# Patient Record
Sex: Male | Born: 1970 | Race: White | Hispanic: No | Smoking: Never smoker
Health system: Southern US, Community
[De-identification: ages and names within clinical notes are randomized; demographics above are authoritative.]

## PROBLEM LIST (undated history)

## (undated) DIAGNOSIS — I471 Supraventricular tachycardia, unspecified: Secondary | ICD-10-CM

## (undated) DIAGNOSIS — R002 Palpitations: Secondary | ICD-10-CM

## (undated) DIAGNOSIS — C649 Malignant neoplasm of unspecified kidney, except renal pelvis: Secondary | ICD-10-CM

## (undated) DIAGNOSIS — E669 Obesity, unspecified: Secondary | ICD-10-CM

## (undated) DIAGNOSIS — I10 Essential (primary) hypertension: Secondary | ICD-10-CM

## (undated) DIAGNOSIS — N289 Disorder of kidney and ureter, unspecified: Secondary | ICD-10-CM

## (undated) HISTORY — DX: Supraventricular tachycardia: I47.1

## (undated) HISTORY — DX: Palpitations: R00.2

## (undated) HISTORY — PX: TONSILLECTOMY: SUR1361

## (undated) HISTORY — DX: Essential (primary) hypertension: I10

## (undated) HISTORY — DX: Supraventricular tachycardia, unspecified: I47.10

## (undated) HISTORY — DX: Obesity, unspecified: E66.9

---

## 2004-05-18 ENCOUNTER — Emergency Department (HOSPITAL_COMMUNITY): Admission: EM | Admit: 2004-05-18 | Discharge: 2004-05-18 | Payer: Self-pay | Admitting: *Deleted

## 2010-04-25 ENCOUNTER — Emergency Department (HOSPITAL_COMMUNITY): Admission: EM | Admit: 2010-04-25 | Discharge: 2010-04-25 | Payer: Self-pay | Admitting: Emergency Medicine

## 2010-05-08 ENCOUNTER — Ambulatory Visit (HOSPITAL_COMMUNITY): Admission: RE | Admit: 2010-05-08 | Discharge: 2010-05-08 | Payer: Self-pay | Admitting: Urology

## 2011-01-20 LAB — URINALYSIS, ROUTINE W REFLEX MICROSCOPIC
Bilirubin Urine: NEGATIVE
Glucose, UA: NEGATIVE mg/dL
Ketones, ur: NEGATIVE mg/dL
Nitrite: NEGATIVE
pH: 6.5 (ref 5.0–8.0)

## 2011-04-15 ENCOUNTER — Other Ambulatory Visit: Payer: Self-pay | Admitting: Cardiovascular Disease

## 2011-04-15 MED ORDER — NEBIVOLOL HCL 10 MG PO TABS: 10.0000 mg | ORAL_TABLET | Freq: Every day | ORAL | Status: DC

## 2011-04-15 NOTE — Telephone Encounter (Signed)
Wife called and informed he needs an office visit before meds run out again. 90 days given, needs one year visit. Wife verbalized understanding. Alfonso Ramus RN

## 2011-04-15 NOTE — Telephone Encounter (Signed)
NEEDS REFILL FOR BYSTOLIC 10MG  ONE DAILY/USES [facility] FAX [phone]. PHONE [phone]. CHART IN BOX.

## 2011-05-07 ENCOUNTER — Ambulatory Visit: Payer: Self-pay | Admitting: Cardiovascular Disease

## 2011-05-28 ENCOUNTER — Encounter: Payer: Self-pay | Admitting: Cardiovascular Disease

## 2011-05-30 ENCOUNTER — Encounter: Payer: Self-pay | Admitting: Cardiovascular Disease

## 2011-05-30 ENCOUNTER — Other Ambulatory Visit: Payer: Self-pay | Admitting: *Deleted

## 2011-05-30 ENCOUNTER — Ambulatory Visit (INDEPENDENT_AMBULATORY_CARE_PROVIDER_SITE_OTHER): Payer: BC Managed Care – PPO | Admitting: Cardiovascular Disease

## 2011-05-30 DIAGNOSIS — I10 Essential (primary) hypertension: Secondary | ICD-10-CM | POA: Insufficient documentation

## 2011-05-30 DIAGNOSIS — I471 Supraventricular tachycardia, unspecified: Secondary | ICD-10-CM

## 2011-05-30 DIAGNOSIS — E669 Obesity, unspecified: Secondary | ICD-10-CM | POA: Insufficient documentation

## 2011-05-30 DIAGNOSIS — I498 Other specified cardiac arrhythmias: Secondary | ICD-10-CM

## 2011-05-30 HISTORY — DX: Supraventricular tachycardia, unspecified: I47.10

## 2011-05-30 MED ORDER — NEBIVOLOL HCL 10 MG PO TABS: 10.0000 mg | ORAL_TABLET | Freq: Every day | ORAL | Status: DC

## 2011-05-30 NOTE — Assessment & Plan Note (Signed)
Tyrone Barnett is doing very well from a cardiac standpoint.  His blood pressure is fairly well controlled. I've encouraged him to continue with a good diet and exercise and weight loss program. I'll see him again in one year.

## 2011-05-30 NOTE — Assessment & Plan Note (Signed)
He's been gradually losing some weight. We will continue with the same program.

## 2011-05-30 NOTE — Progress Notes (Signed)
Nicole Cella Date of Birth  [birth year] [facility] / [facility] [address]     [address] [place], [place]  [address] [phone]  Fax  [phone]  History of Present Illness:  Golden is a 40 year old gentleman with a history of supraventricular tachycardia and hypertension. Also has a history of obesity. He has done very well since I last saw him a year ago. He's been working out on a regular basis and has lost some weight.  He denies any chest pain or shortness of breath.  Current Outpatient Prescriptions on File Prior to Visit  Medication Sig Dispense Refill  . allopurinol (ZYLOPRIM) 100 MG tablet Take 100 mg by mouth daily.        Marland Kitchen aspirin 81 MG tablet Take 81 mg by mouth daily.        . Fiber CAPS Take by mouth daily.        . Multiple Vitamin (MULTIVITAMIN) tablet Take 1 tablet by mouth daily.        . nebivolol (BYSTOLIC) 10 MG tablet Take 1 tablet (10 mg total) by mouth daily.  90 tablet  0    No Known Allergies  Past Medical History  Diagnosis Date  . Hypertension   . Palpitations   . SVT (supraventricular tachycardia)   . Obesity     Past Surgical History  Procedure Date  . Tonsillectomy     History  Smoking status  . Never Smoker   Smokeless tobacco  . Not on file    History  Alcohol Use No    Family History  Problem Relation Age of Onset  . Hypertension Mother   . Lupus Brother   . Heart attack Maternal Grandfather     X2    Reviw of Systems:  Reviewed in the HPI.  All other systems are negative.  Physical Exam: BP 152/114  Pulse 79  Ht 5\' 10"  (1.778 m)  Wt 332 lb (150.594 kg)  BMI 47.64 kg/m2. Repeat BP was 130/90  The patient is alert and oriented x 3.  The mood and affect are normal.   Skin: warm and dry.  Color is normal.    HEENT:   the sclera are nonicteric.  The mucous membranes are moist.  The carotids are 2+ without bruits.  There is no thyromegaly.  There is no JVD.    Lungs: clear.  The  chest wall is non tender.    Heart: regular rate with a normal S1 and S2.  There are no murmurs, gallops, or rubs. The PMI is not displaced.     Abdomen: good bowel sounds.  There is no guarding or rebound.  There is no hepatosplenomegaly or tenderness.  There are no masses.   Extremities:  no clubbing, cyanosis, or edema.  The legs are without rashes.  The distal pulses are intact.   Neuro:  Cranial nerves II - XII are intact.  Motor and sensory functions are intact.    The gait is normal.  Assessment / Plan:

## 2011-05-30 NOTE — Telephone Encounter (Signed)
Patient request refill. DONE Tyrone Ramus RN

## 2011-05-31 ENCOUNTER — Encounter: Payer: Self-pay | Admitting: Cardiovascular Disease

## 2011-06-06 NOTE — Progress Notes (Signed)
msg left to call with pcp info to forward office note.

## 2012-06-06 ENCOUNTER — Ambulatory Visit: Payer: BC Managed Care – PPO | Admitting: Family Medicine

## 2012-06-06 VITALS — BP 148/92 | HR 63 | Temp 98.2°F | Resp 18 | Ht 69.0 in | Wt 329.0 lb

## 2012-06-06 DIAGNOSIS — I10 Essential (primary) hypertension: Secondary | ICD-10-CM

## 2012-06-06 DIAGNOSIS — M109 Gout, unspecified: Secondary | ICD-10-CM

## 2012-06-06 LAB — COMPREHENSIVE METABOLIC PANEL
ALT: 35 U/L (ref 0–53)
AST: 25 U/L (ref 0–37)
Albumin: 4.4 g/dL (ref 3.5–5.2)
Alkaline Phosphatase: 98 U/L (ref 39–117)
BUN: 13 mg/dL (ref 6–23)
CO2: 29 mEq/L (ref 19–32)
Calcium: 10 mg/dL (ref 8.4–10.5)
Chloride: 104 mEq/L (ref 96–112)
Creat: 0.9 mg/dL (ref 0.50–1.35)
Glucose, Bld: 91 mg/dL (ref 70–99)
Potassium: 4.4 mEq/L (ref 3.5–5.3)
Sodium: 139 mEq/L (ref 135–145)
Total Bilirubin: 1.1 mg/dL (ref 0.3–1.2)
Total Protein: 6.7 g/dL (ref 6.0–8.3)

## 2012-06-06 LAB — URIC ACID: Uric Acid, Serum: 7.9 mg/dL — ABNORMAL HIGH (ref 4.0–7.8)

## 2012-06-06 MED ORDER — NEBIVOLOL HCL 10 MG PO TABS: 10.0000 mg | ORAL_TABLET | Freq: Every day | ORAL | Status: DC

## 2012-06-06 MED ORDER — ALLOPURINOL 100 MG PO TABS: 100.0000 mg | ORAL_TABLET | Freq: Every day | ORAL | Status: DC

## 2012-06-06 NOTE — Progress Notes (Signed)
@[email or web address] @  Patient ID: Tyrone Barnett MRN: [id number], DOB: [birth year], 41 y.o. Date of Encounter: 06/06/2012, 11:40 AM  Primary Physician: [name], MD  Chief Complaint: HTN  HPI: 41 y.o. year old male with history below presents for hypertension follow up.  Also, he needs refill on his gout medicine.  Since the last time I saw Tyrone Barnett, he has started working for [employer]  and his wife has gotten pregnant (due around Thanksgiving).  He has not had any gout attacks.  He asked me to write a letter authorizing concealed weapons permit.  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  He is seen with wife  Diet consists of Peps[employer] No CP, HA, visual changes, or focal deficits.   Past Medical History  Diagnosis Date  . Hypertension   . Palpitations   . SVT (supraventricular tachycardia)   . Obesity      Home Meds: Prior to Admission medications   Medication Sig Start Date End Date Taking? Authorizing Provider  allopurinol (ZYLOPRIM) 100 MG tablet Take 1 tablet (100 mg total) by mouth daily. 06/06/12  Yes [name], MD  aspirin 81 MG tablet Take 81 mg by mouth daily.     Yes Historical Provider, MD  Fiber CAPS Take by mouth daily.     Yes Historical Provider, MD  Multiple Vitamin (MULTIVITAMIN) tablet Take 1 tablet by mouth daily.     Yes Historical Provider, MD  nebivolol (BYSTOLIC) 10 MG tablet Take 1 tablet (10 mg total) by mouth daily. 06/06/12  Yes [name], MD    Allergies: No Known Allergies  History   Social History  . Marital Status: Married    Spouse Name: N/A    Number of Children: N/A  . Years of Education: N/A   Occupational History  . Not on file.   Social History Main Topics  . Smoking status: Never Smoker   . Smokeless tobacco: Not on file  . Alcohol Use: No  . Drug Use: No  . Sexually Active:    Other Topics Concern  . Not on file   Social History Narrative  . No narrative on file      Family History  Problem Relation Age of Onset  . Hypertension Mother   . Lupus Brother   . Heart attack Maternal Grandfather     X2    Review of Systems: Constitutional: negative for chills, fever, night sweats, weight changes, or fatigue  HEENT: negative for vision changes, hearing loss, congestion, rhinorrhea, ST, epistaxis, or sinus pressure Cardiovascular: negative for chest pain, palpitations, or DOE Respiratory: negative for hemoptysis, wheezing, shortness of breath, or cough Abdominal: negative for abdominal pain, nausea, vomiting, diarrhea, or constipation Dermatological: negative for rash Neurologic: negative for headache, dizziness, or syncope All other systems reviewed and are otherwise negative with the exception to those above and in the HPI.   Physical Exam:  BP recheck at 145/94 Blood pressure 148/92, pulse 63, temperature 98.2 F (36.8 C), temperature source Oral, resp. rate 18, height 5\' 9"  (1.753 m), weight 329 lb (149.233 kg), SpO2 97.00%., Body mass index is 48.58 kg/(m^2). General: Well developed, well nourished, in no acute distress. Head: Normocephalic, atraumatic, eyes without discharge, sclera non-icteric, nares are without discharge. Bilateral auditory canals clear, TM's are without perforation, pearly grey and translucent with reflective cone of light bilaterally. Oral cavity moist, posterior pharynx without exudate, erythema, peritonsillar abscess, or post nasal drip.  Neck: Supple. No  thyromegaly. Full ROM. No lymphadenopathy. No carotid bruits. Lungs: Clear bilaterally to auscultation without wheezes, rales, or rhonchi. Breathing is unlabored. Heart: RRR with S1 S2. No murmurs, rubs, or gallops appreciated.  Abdomen: Soft, non-tender, non-distended with normoactive bowel sounds. No hepatosplenomegaly. No rebound/guarding. No obvious abdominal masses. Msk:  Strength and tone normal for age. Extremities/Skin: Warm and dry. No clubbing or cyanosis. No  edema. No rashes or suspicious lesions. Distal pulses 2+ and equal bilaterally. Neuro: Alert and oriented X 3. Moves all extremities spontaneously. Gait is normal. CNII-XII grossly in tact. DTR 2+, cerebellar function intact. Rhomberg normal. Psych:  Responds to questions appropriately with a normal affect.   Labs:  CMP pending  ASSESSMENT AND PLAN:  41 y.o. year old male with obesity, gout and hypertension.  He agrees to start a weight loss and exercise program in consideration of impending fatherhood. 1. Gout  Comprehensive metabolic panel, Uric Acid, allopurinol (ZYLOPRIM) 100 MG tablet  2. Hypertension  nebivolol (BYSTOLIC) 10 MG tablet   See letter written for concealed gun permit -  Signed, [name], MD 06/06/2012 11:40 AM

## 2013-05-23 ENCOUNTER — Emergency Department (HOSPITAL_COMMUNITY)
Admission: EM | Admit: 2013-05-23 | Discharge: 2013-05-23 | Disposition: A | Discharge status: Home / Self Care | Payer: BC Managed Care – PPO | Attending: Emergency Medicine | Admitting: Emergency Medicine

## 2013-05-23 ENCOUNTER — Encounter (HOSPITAL_COMMUNITY): Payer: Self-pay | Admitting: *Deleted

## 2013-05-23 ENCOUNTER — Emergency Department (HOSPITAL_COMMUNITY): Payer: BC Managed Care – PPO

## 2013-05-23 DIAGNOSIS — Z7982 Long term (current) use of aspirin: Secondary | ICD-10-CM | POA: Insufficient documentation

## 2013-05-23 DIAGNOSIS — Z79899 Other long term (current) drug therapy: Secondary | ICD-10-CM | POA: Insufficient documentation

## 2013-05-23 DIAGNOSIS — I10 Essential (primary) hypertension: Secondary | ICD-10-CM | POA: Insufficient documentation

## 2013-05-23 DIAGNOSIS — Y929 Unspecified place or not applicable: Secondary | ICD-10-CM | POA: Insufficient documentation

## 2013-05-23 DIAGNOSIS — IMO0002 Reserved for concepts with insufficient information to code with codable children: Secondary | ICD-10-CM | POA: Insufficient documentation

## 2013-05-23 DIAGNOSIS — T148XXA Other injury of unspecified body region, initial encounter: Secondary | ICD-10-CM

## 2013-05-23 DIAGNOSIS — E669 Obesity, unspecified: Secondary | ICD-10-CM | POA: Insufficient documentation

## 2013-05-23 DIAGNOSIS — X58XXXA Exposure to other specified factors, initial encounter: Secondary | ICD-10-CM | POA: Insufficient documentation

## 2013-05-23 DIAGNOSIS — R1032 Left lower quadrant pain: Secondary | ICD-10-CM | POA: Insufficient documentation

## 2013-05-23 DIAGNOSIS — Z87442 Personal history of urinary calculi: Secondary | ICD-10-CM | POA: Insufficient documentation

## 2013-05-23 DIAGNOSIS — Y9389 Activity, other specified: Secondary | ICD-10-CM | POA: Insufficient documentation

## 2013-05-23 HISTORY — DX: Disorder of kidney and ureter, unspecified: N28.9

## 2013-05-23 MED ORDER — OXYCODONE-ACETAMINOPHEN 5-325 MG PO TABS: 1.0000 | ORAL_TABLET | ORAL | Status: DC | PRN

## 2013-05-23 MED ORDER — OXYCODONE-ACETAMINOPHEN 5-325 MG PO TABS
1.0000 | ORAL_TABLET | Freq: Once | ORAL | Status: AC
  Administered 2013-05-23: 1 via ORAL
  Filled 2013-05-23: qty 1

## 2013-05-23 NOTE — ED Provider Notes (Signed)
History  This chart was scribed for [name], MD by Ardelia Mems, ED Scribe. This patient was seen in room APA09/APA09 and the patient's care was started at 8:40 PM.  CSN: [id number]  Arrival date & time 05/23/13  2007   Chief Complaint  Patient presents with  . Groin Pain    The history is provided by the patient. No language interpreter was used.   HPI Comments: Tyrone Barnett is a 42 y.o. male with a hx of obesity, HTN and kidney stones who presents to the Emergency Department complaining of 1 month of intermittent left-sided groin pain. Pt states that his pain became worse last night when he was moving a water heater. He states that his pain is worse with certain movements or rotations of his hips. He denies pain with raising his left leg. There is no associated swelling. He states that his current pain doesn't feel pain associated with past kidney stones. He denies any recent falls or other traumas. He denies pain, swelling or other symptoms of his penis, testicles or scrotum. He also denies dysuria, hematuria, frequency, urgency, diarrhea, constipation, fever or any other symptoms. Pt is an occasional alcohol user and denies any hx of smoking.   PCP- Dr. [name]   Past Medical History  Diagnosis Date  . Hypertension   . Palpitations   . SVT (supraventricular tachycardia)   . Obesity   . Renal disorder     kidney stones   Past Surgical History  Procedure Laterality Date  . Tonsillectomy     Family History  Problem Relation Age of Onset  . Hypertension Mother   . Lupus Brother   . Heart attack Maternal Grandfather     X2   History  Substance Use Topics  . Smoking status: Never Smoker   . Smokeless tobacco: Not on file  . Alcohol Use: Yes     Comment: occasionally    Review of Systems  Gastrointestinal: Negative for diarrhea and constipation.  Genitourinary: Negative for dysuria, urgency, frequency, hematuria, penile swelling, scrotal swelling,  penile pain and testicular pain.       Left-sided groin pain.  All other systems reviewed and are negative.   Allergies  Review of patient's allergies indicates no known allergies.  Home Medications   Current Outpatient Rx  Name  Route  Sig  Dispense  Refill  . allopurinol (ZYLOPRIM) 100 MG tablet   Oral   Take 1 tablet (100 mg total) by mouth daily.   90 tablet   3   . aspirin 81 MG tablet   Oral   Take 81 mg by mouth daily.           . Fiber CAPS   Oral   Take 3 tablets by mouth daily.          . Multiple Vitamin (MULTIVITAMIN) tablet   Oral   Take 1 tablet by mouth daily.           . nebivolol (BYSTOLIC) 10 MG tablet   Oral   Take 1 tablet (10 mg total) by mouth daily.   90 tablet   3   . oxyCODONE-acetaminophen (PERCOCET) 5-325 MG per tablet   Oral   Take 1 tablet by mouth every 4 (four) hours as needed for pain.   20 tablet   0    Triage Vitals: BP 149/90  Pulse 84  Temp(Src) 98.3 F (36.8 C) (Oral)  Resp 20  Ht 5\' 10"  (1.778 m)  Wt 300 lb (136.079 kg)  BMI 43.05 kg/m2  SpO2 100%  Physical Exam  Nursing note and vitals reviewed. Constitutional: He is oriented to person, place, and time. He appears well-developed and well-nourished.  HENT:  Head: Normocephalic and atraumatic.  Right Ear: External ear normal.  Left Ear: External ear normal.  Eyes: Conjunctivae and EOM are normal. Pupils are equal, round, and reactive to light.  Neck: Normal range of motion and phonation normal. Neck supple.  Cardiovascular: Normal rate, regular rhythm, normal heart sounds and intact distal pulses.   Pulmonary/Chest: Effort normal and breath sounds normal. He exhibits no bony tenderness.  Abdominal: Soft. Normal appearance. There is tenderness.  Very low, mild LLQ abdominal tenderness.  Genitourinary: Penis normal.  No tenderness or swelling along the left inguinal ligament. No CVA tenderness. Normal scrotum and testes.  Musculoskeletal: Normal range of  motion.  Tender left anterior superior iliac spine, mild. No mass. Normal ROM of the legs without pain.   Neurological: He is alert and oriented to person, place, and time. He has normal strength. No cranial nerve deficit or sensory deficit. He exhibits normal muscle tone. Coordination normal.  Skin: Skin is warm, dry and intact.  Psychiatric: He has a normal mood and affect. His behavior is normal. Judgment and thought content normal.    ED Course  Procedures (including critical care time)  DIAGNOSTIC STUDIES:  Patient Vitals for the past 24 hrs:  BP Temp Temp src Pulse Resp SpO2 Height Weight  05/23/13 2011 149/90 mmHg 98.3 F (36.8 C) Oral 84 20 100 % 5\' 10"  (1.778 m) 300 lb (136.079 kg)    COORDINATION OF CARE: 8:45 PM- Pt advised of plan for treatment and pt agrees. 9:42 PM- Recheck with pt and pt is feeling better after receiving Percocet. Pt informed of normal radiology findings and pt is agreeable to discharge. Pt agrees to follow up with his PCP if symptoms persist or worsen.   Medications  oxyCODONE-acetaminophen (PERCOCET/ROXICET) 5-325 MG per tablet 1 tablet (1 tablet Oral Given 05/23/13 2108)     Labs Reviewed - No data to display  Dg Pelvis 1-2 Views  05/23/2013   *RADIOLOGY REPORT*  Clinical Data: Groin pain.  Denies injury.  PELVIS - 1-2 VIEW  Comparison: Abdomen and pelvis CT, 04/25/2010  Findings: No fracture.  No bone lesion.  The hip joints, SI joints and symphysis pubis are normally spaced and aligned.  The soft tissues are unremarkable.  IMPRESSION: Normal AP pelvis radiograph.   Original Report Authenticated By: Amie Portland, M.D.    1. Muscle strain     MDM  Nonspecific left groin and pelvic pain, most likely related to a muscle strain. Doubt fracture, bony tumor, intra-abdominal infectious process or metabolic instability. He is stable for discharge with symptomatic treatment.  Nursing Notes Reviewed/ Care Coordinated, and agree without  changes. Applicable Imaging Reviewed.  Interpretation of Laboratory Data incorporated into ED treatment    Plan: Home Medications- Percocet; Home Treatments and Observation- Heat; return here if the recommended treatment, does not improve the symptoms; Recommended follow up- PCP prn          I personally performed the services described in this documentation, which was scribed in my presence. The recorded information has been reviewed and is accurate.     [name], MD 05/23/13 [phone]

## 2013-05-23 NOTE — ED Notes (Signed)
Pt reporting pain in left groin.  Reports pain worse last night and today after moving a hot water heater yesterday. Denies any radiation of pain. States pain "worse with I move a certain way".

## 2013-05-23 NOTE — ED Notes (Signed)
Pt c/o pain to left groin area

## 2013-06-29 ENCOUNTER — Other Ambulatory Visit: Payer: Self-pay | Admitting: Family Medicine

## 2013-07-08 ENCOUNTER — Other Ambulatory Visit: Payer: Self-pay | Admitting: Family Medicine

## 2013-07-15 ENCOUNTER — Encounter: Payer: Self-pay | Admitting: Family Medicine

## 2013-07-15 ENCOUNTER — Ambulatory Visit (INDEPENDENT_AMBULATORY_CARE_PROVIDER_SITE_OTHER): Payer: BC Managed Care – PPO | Admitting: Family Medicine

## 2013-07-15 VITALS — BP 142/92 | HR 61 | Temp 98.7°F | Resp 18 | Ht 68.75 in | Wt 339.0 lb

## 2013-07-15 DIAGNOSIS — M109 Gout, unspecified: Secondary | ICD-10-CM

## 2013-07-15 DIAGNOSIS — I10 Essential (primary) hypertension: Secondary | ICD-10-CM

## 2013-07-15 LAB — COMPREHENSIVE METABOLIC PANEL
ALT: 42 U/L (ref 0–53)
AST: 25 U/L (ref 0–37)
Albumin: 4.5 g/dL (ref 3.5–5.2)
Alkaline Phosphatase: 103 U/L (ref 39–117)
BUN: 11 mg/dL (ref 6–23)
CO2: 26 mEq/L (ref 19–32)
Calcium: 9.6 mg/dL (ref 8.4–10.5)
Chloride: 103 mEq/L (ref 96–112)
Creat: 0.89 mg/dL (ref 0.50–1.35)
Glucose, Bld: 108 mg/dL — ABNORMAL HIGH (ref 70–99)
Potassium: 4.4 mEq/L (ref 3.5–5.3)
Sodium: 139 mEq/L (ref 135–145)
Total Bilirubin: 1.1 mg/dL (ref 0.3–1.2)
Total Protein: 7 g/dL (ref 6.0–8.3)

## 2013-07-15 LAB — URIC ACID: Uric Acid, Serum: 7.4 mg/dL (ref 4.0–7.8)

## 2013-07-15 MED ORDER — ALLOPURINOL 100 MG PO TABS: ORAL_TABLET | ORAL | Status: DC

## 2013-07-15 MED ORDER — NEBIVOLOL HCL 10 MG PO TABS: 10.0000 mg | ORAL_TABLET | Freq: Every day | ORAL | Status: DC

## 2013-07-15 NOTE — Progress Notes (Signed)
Patient ID: Tyrone Barnett MRN: [id number], DOB: 1971/03/17, 42 y.o. Date of Encounter: 07/15/2013, 8:03 AM  Primary Physician: [name], MD  Chief Complaint: HTN  HPI: 42 y.o. year old male with history below presents for hypertension follow up.  Works for [employer]    Diet consists of No CP, HA, visual changes, or focal deficits.   Past Medical History  Diagnosis Date  . Hypertension   . Palpitations   . SVT (supraventricular tachycardia)   . Obesity   . Renal disorder     kidney stones     Home Meds: Prior to Admission medications   Medication Sig Start Date End Date Taking? Authorizing Provider  allopurinol (ZYLOPRIM) 100 MG tablet Take 1 tablet (100 mg total) by mouth daily. Needs office visit 06/29/13  Yes Nelva Nay, PA-C  aspirin 81 MG tablet Take 81 mg by mouth daily.     Yes Historical Provider, MD  BYSTOLIC 10 MG tablet TAKE 1 TABLET DAILY 07/08/13  Yes [name], MD  Fiber CAPS Take 3 tablets by mouth daily.    Yes Historical Provider, MD  Multiple Vitamin (MULTIVITAMIN) tablet Take 1 tablet by mouth daily.     Yes Historical Provider, MD    Allergies: No Known Allergies  History   Social History  . Marital Status: Married    Spouse Name: N/A    Number of Children: N/A  . Years of Education: N/A   Occupational History  . Not on file.   Social History Main Topics  . Smoking status: Never Smoker   . Smokeless tobacco: Not on file  . Alcohol Use: Yes     Comment: occasionally  . Drug Use: No  . Sexual Activity: Not on file   Other Topics Concern  . Not on file   Social History Narrative  . No narrative on file     Family History  Problem Relation Age of Onset  . Hypertension Mother   . Lupus Brother   . Heart attack Maternal Grandfather     X2    Review of Systems: Constitutional: negative for chills, fever, night sweats, weight changes, or fatigue  HEENT: negative for vision changes, hearing loss, congestion, rhinorrhea, ST,  epistaxis, or sinus pressure Cardiovascular: negative for chest pain, palpitations, or DOE Respiratory: negative for hemoptysis, wheezing, shortness of breath, or cough Abdominal: negative for abdominal pain, nausea, vomiting, diarrhea, or constipation Dermatological: negative for rash Neurologic: negative for headache, dizziness, or syncope All other systems reviewed and are otherwise negative with the exception to those above and in the HPI.   Physical Exam: Blood pressure 142/92, pulse 61, temperature 98.7 F (37.1 C), temperature source Oral, resp. rate 18, height 5' 8.75" (1.746 m), weight 339 lb (153.769 kg), SpO2 97.00%., Body mass index is 50.44 kg/(m^2). General: Well developed, well nourished, in no acute distress. Head: Normocephalic, atraumatic, eyes without discharge, sclera non-icteric, nares are without discharge. Bilateral auditory canals clear, TM's are without perforation, pearly grey and translucent with reflective cone of light bilaterally. Oral cavity moist, posterior pharynx without exudate, erythema, peritonsillar abscess, or post nasal drip.  Neck: Supple. No thyromegaly. Full ROM. No lymphadenopathy. No carotid bruits. Lungs: Clear bilaterally to auscultation without wheezes, rales, or rhonchi. Breathing is unlabored. Heart: RRR with S1 S2. No murmurs, rubs, or gallops appreciated.  Abdomen: Soft, non-tender, non-distended with normoactive bowel sounds. No hepatosplenomegaly. No rebound/guarding. No obvious abdominal masses. Msk:  Strength and tone normal for age. Extremities/Skin: Warm and dry. No  clubbing or cyanosis. No edema. No rashes or suspicious lesions. Distal pulses 2+ and equal bilaterally. Neuro: Alert and oriented X 3. Moves all extremities spontaneously. Gait is normal. CNII-XII grossly in tact. DTR 2+, cerebellar function intact. Rhomberg normal. Psych:  Responds to questions appropriately with a normal affect.    ASSESSMENT AND PLAN:  42 y.o. year old  male with  -  Signed, [name], MD 07/15/2013 8:03 AM

## 2013-07-15 NOTE — Progress Notes (Signed)
Patient ID: Tyrone Barnett MRN: [id number], DOB: 14-Dec-1970, 42 y.o. Date of Encounter: 07/15/2013, 8:13 AM  Primary Physician: [name], MD  Chief Complaint: HTN  HPI: 42 y.o. year old male with history below presents for hypertension follow up. Daughter is now 10 months and doing well.  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  No CP, HA, visual changes, or focal deficits.   Past Medical History  Diagnosis Date  . Hypertension   . Palpitations   . SVT (supraventricular tachycardia)   . Obesity   . Renal disorder     kidney stones     Home Meds: Prior to Admission medications   Medication Sig Start Date End Date Taking? Authorizing Provider  allopurinol (ZYLOPRIM) 100 MG tablet One daily 07/15/13  Yes [name], MD  aspirin 81 MG tablet Take 81 mg by mouth daily.     Yes Historical Provider, MD  Fiber CAPS Take 3 tablets by mouth daily.    Yes Historical Provider, MD  Multiple Vitamin (MULTIVITAMIN) tablet Take 1 tablet by mouth daily.     Yes Historical Provider, MD  nebivolol (BYSTOLIC) 10 MG tablet Take 1 tablet (10 mg total) by mouth daily. 07/15/13  Yes [name], MD    Allergies: No Known Allergies  History   Social History  . Marital Status: Married    Spouse Name: N/A    Number of Children: N/A  . Years of Education: N/A   Occupational History  . Not on file.   Social History Main Topics  . Smoking status: Never Smoker   . Smokeless tobacco: Not on file  . Alcohol Use: Yes     Comment: occasionally  . Drug Use: No  . Sexual Activity: Not on file   Other Topics Concern  . Not on file   Social History Narrative  . No narrative on file     Family History  Problem Relation Age of Onset  . Hypertension Mother   . Lupus Brother   . Heart attack Maternal Grandfather     X2    Review of Systems: Constitutional: negative for chills, fever, night sweats, weight changes, or  fatigue  HEENT: negative for vision changes, hearing loss, congestion, rhinorrhea, ST, epistaxis, or sinus pressure Cardiovascular: negative for chest pain, palpitations, or DOE Respiratory: negative for hemoptysis, wheezing, shortness of breath, or cough Abdominal: negative for abdominal pain, nausea, vomiting, diarrhea, or constipation Dermatological: negative for rash Neurologic: negative for headache, dizziness, or syncope All other systems reviewed and are otherwise negative with the exception to those above and in the HPI.   Physical Exam:   140/90 recheck  Blood pressure 142/92, pulse 61, temperature 98.7 F (37.1 C), temperature source Oral, resp. rate 18, height 5' 8.75" (1.746 m), weight 339 lb (153.769 kg), SpO2 97.00%., Body mass index is 50.44 kg/(m^2). General: Well developed, well nourished, in no acute distress. Head: Normocephalic, atraumatic, eyes without discharge, sclera non-icteric, nares are without discharge. Bilateral auditory canals clear, TM's are without perforation, pearly grey and translucent with reflective cone of light bilaterally. Oral cavity moist, posterior pharynx without exudate, erythema, peritonsillar abscess, or post nasal drip.  Neck: Supple. No thyromegaly. Full ROM. No lymphadenopathy. No carotid bruits. Lungs: Clear bilaterally to auscultation without wheezes, rales, or rhonchi. Breathing is unlabored. Heart: RRR with S1 S2. No murmurs, rubs, or gallops appreciated.  Abdomen: Soft, non-tender, non-distended with normoactive bowel sounds. No hepatosplenomegaly. No  rebound/guarding. No obvious abdominal masses. Msk:  Strength and tone normal for age. Extremities/Skin: Warm and dry. No clubbing or cyanosis. No edema. No rashes or suspicious lesions. Distal pulses 2+ and equal bilaterally. Neuro: Alert and oriented X 3. Moves all extremities spontaneously. Gait is normal. CNII-XII grossly in tact. DTR 2+, cerebellar function intact. Rhomberg  normal. Psych:  Responds to questions appropriately with a normal affect.    CMP pending  ASSESSMENT AND PLAN:  42 y.o. year old male with hypertension and h/o gout Gout - Plan: allopurinol (ZYLOPRIM) 100 MG tablet, Comprehensive metabolic panel  Hypertension - Plan: Comprehensive metabolic panel   -  Signed, [name], MD 07/15/2013 8:13 AM

## 2013-07-15 NOTE — Patient Instructions (Addendum)
Hypertension As your heart beats, it forces blood through your arteries. This force is your blood pressure. If the pressure is too high, it is called hypertension (HTN) or high blood pressure. HTN is dangerous because you may have it and not know it. High blood pressure may mean that your heart has to work harder to pump blood. Your arteries may be narrow or stiff. The extra work puts you at risk for heart disease, stroke, and other problems.  Blood pressure consists of two numbers, a higher number over a lower, 110/72, for example. It is stated as "110 over 72." The ideal is below 120 for the top number (systolic) and under 80 for the bottom (diastolic). Write down your blood pressure today. You should pay close attention to your blood pressure if you have certain conditions such as:  Heart failure.  Prior heart attack.  Diabetes  Chronic kidney disease.  Prior stroke.  Multiple risk factors for heart disease. To see if you have HTN, your blood pressure should be measured while you are seated with your arm held at the level of the heart. It should be measured at least twice. A one-time elevated blood pressure reading (especially in the Emergency Department) does not mean that you need treatment. There may be conditions in which the blood pressure is different between your right and left arms. It is important to see your caregiver soon for a recheck. Most people have essential hypertension which means that there is not a specific cause. This type of high blood pressure may be lowered by changing lifestyle factors such as:  Stress.  Smoking.  Lack of exercise.  Excessive weight.  Drug/tobacco/alcohol use.  Eating less salt. Most people do not have symptoms from high blood pressure until it has caused damage to the body. Effective treatment can often prevent, delay or reduce that damage. TREATMENT  When a cause has been identified, treatment for high blood pressure is directed at the  cause. There are a large number of medications to treat HTN. These fall into several categories, and your caregiver will help you select the medicines that are best for you. Medications may have side effects. You should review side effects with your caregiver. If your blood pressure stays high after you have made lifestyle changes or started on medicines,   Your medication(s) may need to be changed.  Other problems may need to be addressed.  Be certain you understand your prescriptions, and know how and when to take your medicine.  Be sure to follow up with your caregiver within the time frame advised (usually within two weeks) to have your blood pressure rechecked and to review your medications.  If you are taking more than one medicine to lower your blood pressure, make sure you know how and at what times they should be taken. Taking two medicines at the same time can result in blood pressure that is too low. SEEK IMMEDIATE MEDICAL CARE IF:  You develop a severe headache, blurred or changing vision, or confusion.  You have unusual weakness or numbness, or a faint feeling.  You have severe chest or abdominal pain, vomiting, or breathing problems. MAKE SURE YOU:   Understand these instructions.  Will watch your condition.  Will get help right away if you are not doing well or get worse. Document Released: 10/21/2005 Document Revised: 01/13/2012 Document Reviewed: 06/10/2008 ExitCare Patient Information 2014 [employer].  Gout Gout is an inflammatory condition (arthritis) caused by a buildup of uric acid crystals   in the joints. Uric acid is a chemical that is normally present in the blood. Under some circumstances, uric acid can form into crystals in your joints. This causes joint redness, soreness, and swelling (inflammation). Repeat attacks are common. Over time, uric acid crystals can form into masses (tophi) near a joint, causing disfigurement. Gout is treatable and often  preventable. CAUSES  The disease begins with elevated levels of uric acid in the blood. Uric acid is produced by your body when it breaks down a naturally found substance called purines. This also happens when you eat certain foods such as meats and fish. Causes of an elevated uric acid level include: Being passed down from parent to child (heredity). Diseases that cause increased uric acid production (obesity, psoriasis, some cancers). Excessive alcohol use. Diet, especially diets rich in meat and seafood. Medicines, including certain cancer-fighting drugs (chemotherapy), diuretics, and aspirin. Chronic kidney disease. The kidneys are no longer able to remove uric acid well. Problems with metabolism. Conditions strongly associated with gout include: Obesity. High blood pressure. High cholesterol. Diabetes. Not everyone with elevated uric acid levels gets gout. It is not understood why some people get gout and others do not. Surgery, joint injury, and eating too much of certain foods are some of the factors that can lead to gout. SYMPTOMS  An attack of gout comes on quickly. It causes intense pain with redness, swelling, and warmth in a joint. Fever can occur. Often, only one joint is involved. Certain joints are more commonly involved: Base of the big toe. Knee. Ankle. Wrist. Finger. Without treatment, an attack usually goes away in a few days to weeks. Between attacks, you usually will not have symptoms, which is different from many other forms of arthritis. DIAGNOSIS  Your caregiver will suspect gout based on your symptoms and exam. Removal of fluid from the joint (arthrocentesis) is done to check for uric acid crystals. Your caregiver will give you a medicine that numbs the area (local anesthetic) and use a needle to remove joint fluid for exam. Gout is confirmed when uric acid crystals are seen in joint fluid, using a special microscope. Sometimes, blood, urine, and X-ray tests are  also used. TREATMENT  There are 2 phases to gout treatment: treating the sudden onset (acute) attack and preventing attacks (prophylaxis). Treatment of an Acute Attack Medicines are used. These include anti-inflammatory medicines or steroid medicines. An injection of steroid medicine into the affected joint is sometimes necessary. The painful joint is rested. Movement can worsen the arthritis. You may use warm or cold treatments on painful joints, depending which works best for you. Discuss the use of coffee, vitamin C, or cherries with your caregiver. These may be helpful treatment options. Treatment to Prevent Attacks After the acute attack subsides, your caregiver may advise prophylactic medicine. These medicines either help your kidneys eliminate uric acid from your body or decrease your uric acid production. You may need to stay on these medicines for a very long time. The early phase of treatment with prophylactic medicine can be associated with an increase in acute gout attacks. For this reason, during the first few months of treatment, your caregiver may also advise you to take medicines usually used for acute gout treatment. Be sure you understand your caregiver's directions. You should also discuss dietary treatment with your caregiver. Certain foods such as meats and fish can increase uric acid levels. Other foods such as dairy can decrease levels. Your caregiver can give you a list of foods   to avoid. HOME CARE INSTRUCTIONS  Do not take aspirin to relieve pain. This raises uric acid levels. Only take over-the-counter or prescription medicines for pain, discomfort, or fever as directed by your caregiver. Rest the joint as much as possible. When in bed, keep sheets and blankets off painful areas. Keep the affected joint raised (elevated). Use crutches if the painful joint is in your leg. Drink enough water and fluids to keep your urine clear or pale yellow. This helps your body get rid of  uric acid. Do not drink alcoholic beverages. They slow the passage of uric acid. Follow your caregiver's dietary instructions. Pay careful attention to the amount of protein you eat. Your daily diet should emphasize fruits, vegetables, whole grains, and fat-free or low-fat milk products. Maintain a healthy body weight. SEEK MEDICAL CARE IF:  You have an oral temperature above 102 F (38.9 C). You develop diarrhea, vomiting, or any side effects from medicines. You do not feel better in 24 hours, or you are getting worse. SEEK IMMEDIATE MEDICAL CARE IF:  Your joint becomes suddenly more tender and you have: Chills. An oral temperature above 102 F (38.9 C), not controlled by medicine. MAKE SURE YOU:  Understand these instructions. Will watch your condition. Will get help right away if you are not doing well or get worse. Document Released: 10/18/2000 Document Revised: 01/13/2012 Document Reviewed: 01/29/2010 ExitCare Patient Information 2014 [employer].  

## 2013-09-09 ENCOUNTER — Other Ambulatory Visit: Payer: Self-pay

## 2014-06-29 ENCOUNTER — Other Ambulatory Visit: Payer: Self-pay | Admitting: Family Medicine

## 2014-08-05 ENCOUNTER — Other Ambulatory Visit: Payer: Self-pay | Admitting: Family Medicine

## 2014-08-19 ENCOUNTER — Other Ambulatory Visit: Payer: Self-pay

## 2014-09-09 ENCOUNTER — Other Ambulatory Visit: Payer: Self-pay | Admitting: Physician Assistant

## 2014-09-19 ENCOUNTER — Telehealth: Payer: Self-pay

## 2014-09-19 MED ORDER — NEBIVOLOL HCL 10 MG PO TABS: 10.0000 mg | ORAL_TABLET | Freq: Every day | ORAL | Status: DC

## 2014-09-19 NOTE — Telephone Encounter (Signed)
Mary grace from [facility] [address] is calling to say that this pt usually uses mail order pharmacy but is completely out of his Bystolic and needs an rx to carry him thru until mail order arrives  Best phone for Os[place]ary grace at [facility] is [phone]

## 2014-09-19 NOTE — Telephone Encounter (Signed)
Sent in #30 to cover pt until appt in Dec with Dr. Milus GlazierLauenstein.

## 2014-10-06 ENCOUNTER — Encounter: Payer: Self-pay | Admitting: Family Medicine

## 2014-10-06 ENCOUNTER — Ambulatory Visit: Payer: BC Managed Care – PPO | Admitting: Family Medicine

## 2014-10-06 ENCOUNTER — Ambulatory Visit (INDEPENDENT_AMBULATORY_CARE_PROVIDER_SITE_OTHER): Payer: BC Managed Care – PPO | Admitting: Family Medicine

## 2014-10-06 VITALS — BP 130/100 | HR 71 | Temp 98.6°F | Resp 16 | Ht 69.0 in | Wt 341.0 lb

## 2014-10-06 DIAGNOSIS — I471 Supraventricular tachycardia: Secondary | ICD-10-CM

## 2014-10-06 DIAGNOSIS — M10079 Idiopathic gout, unspecified ankle and foot: Secondary | ICD-10-CM

## 2014-10-06 DIAGNOSIS — I10 Essential (primary) hypertension: Secondary | ICD-10-CM

## 2014-10-06 LAB — URIC ACID: Uric Acid, Serum: 7.2 mg/dL (ref 4.0–7.8)

## 2014-10-06 LAB — LIPID PANEL
Cholesterol: 177 mg/dL (ref 0–200)
HDL: 36 mg/dL — ABNORMAL LOW (ref >39)
LDL Cholesterol: 80 mg/dL (ref 0–99)
Total CHOL/HDL Ratio: 4.9 Ratio
Triglycerides: 303 mg/dL — ABNORMAL HIGH (ref <150)
VLDL: 61 mg/dL — ABNORMAL HIGH (ref 0–40)

## 2014-10-06 LAB — COMPREHENSIVE METABOLIC PANEL
ALT: 34 U/L (ref 0–53)
AST: 24 U/L (ref 0–37)
Albumin: 4.2 g/dL (ref 3.5–5.2)
Alkaline Phosphatase: 102 U/L (ref 39–117)
BUN: 10 mg/dL (ref 6–23)
CO2: 27 mEq/L (ref 19–32)
Calcium: 9.5 mg/dL (ref 8.4–10.5)
Chloride: 102 mEq/L (ref 96–112)
Creat: 0.78 mg/dL (ref 0.50–1.35)
Glucose, Bld: 101 mg/dL — ABNORMAL HIGH (ref 70–99)
Potassium: 4.4 mEq/L (ref 3.5–5.3)
Sodium: 139 mEq/L (ref 135–145)
Total Bilirubin: 1.1 mg/dL (ref 0.2–1.2)
Total Protein: 6.9 g/dL (ref 6.0–8.3)

## 2014-10-06 MED ORDER — ALLOPURINOL 100 MG PO TABS: ORAL_TABLET | ORAL | Status: DC

## 2014-10-06 MED ORDER — LISINOPRIL 20 MG PO TABS: 20.0000 mg | ORAL_TABLET | Freq: Every day | ORAL | Status: DC

## 2014-10-06 MED ORDER — NEBIVOLOL HCL 10 MG PO TABS: 10.0000 mg | ORAL_TABLET | Freq: Every day | ORAL | Status: DC

## 2014-10-06 NOTE — Patient Instructions (Signed)

## 2014-10-06 NOTE — Progress Notes (Signed)
Subjective:  This chart was scribed for Tyrone Barnett Dahiana Kulak, MD by Carl Bestelina Holson, Medical Scribe. This patient was seen in Room 27 and the patient's care was started at 9:47 AM.   Patient ID: Tyrone Barnett, male    DOB: 11-Jan-1971, 43 y.o.   MRN: [id number]  HPI HPI Comments: Tyrone CellaDamon J Barnett is a 43 y.o. male who presents to the [facility] for a refill of Allopurinol.  He states that he is feeling well but his 42 year-old father had an MI 2 weeks ago.  His father was discharged from the hospital yesterday after undergoing quadruple bypass surgery.  His blood pressure typically runs 130/90 when he checks it at home.  He has not experienced anymore episodes of SVT or gout.  He does not have a history of smoking.  He exercises occasionally.  He has a 42 year-old daughter.  He works at [employer].    He is complaining of intermittent pain located on the underside of his right foot that started a month and a half ago.  His pain is worse in the morning and subsides throughout the day.  He suspects he may have plantar fascitis.    Past Medical History  Diagnosis Date  . Hypertension   . Palpitations   . SVT (supraventricular tachycardia)   . Obesity   . Renal disorder     kidney stones   Past Surgical History  Procedure Laterality Date  . Tonsillectomy     Family History  Problem Relation Age of Onset  . Hypertension Mother   . Lupus Brother   . Heart attack Maternal Grandfather     X2   History   Social History  . Marital Status: Married    Spouse Name: N/A    Number of Children: N/A  . Years of Education: N/A   Occupational History  . Not on file.   Social History Main Topics  . Smoking status: Never Smoker   . Smokeless tobacco: Not on file  . Alcohol Use: Yes     Comment: occasionally  . Drug Use: No  . Sexual Activity: Not on file   Other Topics Concern  . Not on file   Social History Narrative  . No narrative on file   No Known Allergies  Review of  Systems  Musculoskeletal: Positive for arthralgias.    Objective:  Physical Exam  Constitutional: He is oriented to person, place, and time. He appears well-developed.  Morbidly obese.  HENT:  Head: Normocephalic and atraumatic.  Eyes: EOM are normal.  Neck: Normal range of motion.  Cardiovascular: Normal rate, regular rhythm and normal heart sounds.   No murmur heard. Pulmonary/Chest: Effort normal and breath sounds normal. No respiratory distress. He has no wheezes. He has no rales.  Abdominal: Soft. There is no tenderness.  Musculoskeletal: Normal range of motion.  Neurological: He is alert and oriented to person, place, and time.  Skin: Skin is warm and dry.  Psychiatric: He has a normal mood and affect. His behavior is normal.  Nursing note and vitals reviewed.  BP in exam room - 130/100  BP 130/100 mmHg  Pulse 71  Temp(Src) 98.6 F (37 C) (Oral)  Resp 16  Ht 5\' 9"  (1.753 m)  Wt 341 lb (154.677 kg)  BMI 50.33 kg/m2  SpO2 99% Assessment & Plan:  This chart was scribed in my presence and reviewed by me personally.  SVT (supraventricular tachycardia) - Plan: nebivolol (BYSTOLIC) 10 MG  tablet  Idiopathic gout of foot, unspecified chronicity, unspecified laterality - Plan: allopurinol (ZYLOPRIM) 100 MG tablet, Uric Acid  Essential hypertension - Plan: lisinopril (PRINIVIL,ZESTRIL) 20 MG tablet, Comprehensive metabolic panel, Lipid panel Patient encouraged to exercise more regularly and lose some of that weight. Signed, Tyrone Barnett Demiyah Fischbach, MD

## 2015-03-14 ENCOUNTER — Encounter (HOSPITAL_BASED_OUTPATIENT_CLINIC_OR_DEPARTMENT_OTHER): Payer: Self-pay | Admitting: Emergency Medicine

## 2015-03-14 ENCOUNTER — Emergency Department (HOSPITAL_BASED_OUTPATIENT_CLINIC_OR_DEPARTMENT_OTHER)
Admission: EM | Admit: 2015-03-14 | Discharge: 2015-03-14 | Disposition: A | Discharge status: Home / Self Care | Payer: BLUE CROSS/BLUE SHIELD | Attending: Emergency Medicine | Admitting: Emergency Medicine

## 2015-03-14 DIAGNOSIS — E669 Obesity, unspecified: Secondary | ICD-10-CM | POA: Insufficient documentation

## 2015-03-14 DIAGNOSIS — Z79899 Other long term (current) drug therapy: Secondary | ICD-10-CM | POA: Diagnosis not present

## 2015-03-14 DIAGNOSIS — Z7982 Long term (current) use of aspirin: Secondary | ICD-10-CM | POA: Diagnosis not present

## 2015-03-14 DIAGNOSIS — I10 Essential (primary) hypertension: Secondary | ICD-10-CM | POA: Diagnosis not present

## 2015-03-14 DIAGNOSIS — Z87442 Personal history of urinary calculi: Secondary | ICD-10-CM | POA: Diagnosis not present

## 2015-03-14 DIAGNOSIS — R21 Rash and other nonspecific skin eruption: Secondary | ICD-10-CM | POA: Diagnosis present

## 2015-03-14 DIAGNOSIS — L259 Unspecified contact dermatitis, unspecified cause: Secondary | ICD-10-CM | POA: Diagnosis not present

## 2015-03-14 MED ORDER — TRIAMCINOLONE ACETONIDE 0.1 % EX CREA: 1.0000 "application " | TOPICAL_CREAM | Freq: Two times a day (BID) | CUTANEOUS | Status: DC

## 2015-03-14 NOTE — ED Provider Notes (Signed)
CSN: [id number]     Arrival date & time 03/14/15  1354 History   First MD Initiated Contact with Patient 03/14/15 1407     Chief Complaint  Patient presents with  . Rash     (Consider location/radiation/quality/duration/timing/severity/associated sxs/prior Treatment) Patient is a 44 y.o. male presenting with rash. The history is provided by the patient and medical records.  Rash   This is a 44 year old male with past medical history significant for hypertension and SVT, presenting to the ED for rash to his upper chest and lower neck for the past 24 hours.  Rash is pruritic without associated fever or chills. No home with similar rash. Patient denies any changes in soaps, detergents, or other personal care products. He denies any new foods or medications.  Patient took Claritin prior to arrival with some improvement, but states itching is returning.  No recent tick exposures or other bug bites.  VSS.  Past Medical History  Diagnosis Date  . Hypertension   . Palpitations   . SVT (supraventricular tachycardia)   . Obesity   . Renal disorder     kidney stones   Past Surgical History  Procedure Laterality Date  . Tonsillectomy     Family History  Problem Relation Age of Onset  . Hypertension Mother   . Lupus Brother   . Heart attack Maternal Grandfather     X2   History  Substance Use Topics  . Smoking status: Never Smoker   . Smokeless tobacco: Not on file  . Alcohol Use: Yes     Comment: occasionally    Review of Systems  Skin: Positive for rash.  All other systems reviewed and are negative.     Allergies  Review of patient's allergies indicates no known allergies.  Home Medications   Prior to Admission medications   Medication Sig Start Date End Date Taking? Authorizing Provider  allopurinol (ZYLOPRIM) 100 MG tablet TAKE ONE TABLET BY MOUTH ONCE DAILY 10/06/14   [name] Lauenstein, MD  aspirin 81 MG tablet Take 81 mg by mouth daily.      Historical Provider, MD   Fiber CAPS Take 3 tablets by mouth daily.     Historical Provider, MD  lisinopril (PRINIVIL,ZESTRIL) 20 MG tablet Take 1 tablet (20 mg total) by mouth daily. 10/06/14   [name] Lauenstein, MD  Multiple Vitamin (MULTIVITAMIN) tablet Take 1 tablet by mouth daily.      Historical Provider, MD  nebivolol (BYSTOLIC) 10 MG tablet Take 1 tablet (10 mg total) by mouth daily. 10/06/14   [name] Lauenstein, MD   BP 171/103 mmHg  Pulse 76  Temp(Src) 98.1 F (36.7 C) (Oral)  Resp 18  Ht 5\' 10"  (1.778 m)  Wt 290 lb (131.543 kg)  BMI 41.61 kg/m2  SpO2 98%   Physical Exam  Constitutional: He is oriented to person, place, and time. He appears well-developed and well-nourished. No distress.  HENT:  Head: Normocephalic and atraumatic.  Mouth/Throat: Uvula is midline, oropharynx is clear and moist and mucous membranes are normal.  No oral lesions or oral swelling, handling secretions well, no difficulty swallowing or speaking  Eyes: Conjunctivae and EOM are normal. Pupils are equal, round, and reactive to light.  Neck: Normal range of motion. Neck supple.  Cardiovascular: Normal rate, regular rhythm and normal heart sounds.   Pulmonary/Chest: Effort normal and breath sounds normal. No respiratory distress. He has no wheezes.  Abdominal: Soft. Bowel sounds are normal. There is no tenderness. There is no guarding.  Musculoskeletal: Normal range of motion.  Neurological: He is alert and oriented to person, place, and time.  Skin: Skin is warm and dry. Rash noted. Rash is maculopapular. He is not diaphoretic.  Fine maculopapular rash scattered across anterior upper chest and lower neck; no ulcerative lesions or skin sloughing; no lesions on palms or soles  Psychiatric: He has a normal mood and affect.  Nursing note and vitals reviewed.   ED Course  Procedures (including critical care time) Labs Review Labs Reviewed - No data to display  Imaging Review No results found.   EKG Interpretation None       MDM   Final diagnoses:  Contact dermatitis   44 year old male with fine maculopapular rash to his chest for 1 day. He denies any new soaps, detergents, or other personal care products. No tick exposures or recent blood bites. Rash is pruritic. No one at home with similar rash. No lesions on palms or soles.  No oral lesions or oral swelling.  Rash appears to be a mild, localized contact dermatitis. Will start on topical triamcinolone as well as recommended Benadryl. Patient to follow-up with PCP.  Discussed plan with patient, he/she acknowledged understanding and agreed with plan of care.  Return precautions given for new or worsening symptoms.  Garlon HatchetLisa M Hershell Brandl, PA-C 03/14/15 1507  Linwood DibblesJon Knapp, MD 03/15/15 [phone]

## 2015-03-14 NOTE — ED Notes (Signed)
Pt in c/o macular rash to chest x 1 day. Reports no known allergies, but mild sensitivity to laundry detergent.

## 2015-03-14 NOTE — Discharge Instructions (Signed)
Take the prescribed medication as directed.  Also recommend to take bendaryl-- may cut tablets in half if make you drowsy. Follow-up with your primary care physician. Return to the ED for new or worsening symptoms.

## 2015-03-27 IMAGING — CR DG PELVIS 1-2V
1 series · 1 of 1 positions shown · non-contrast
Comparison: Abdomen and pelvis CT, 04/25/2010

CLINICAL DATA: Groin pain.  Denies injury.

PELVIS - 1-2 VIEW

[view not recorded]
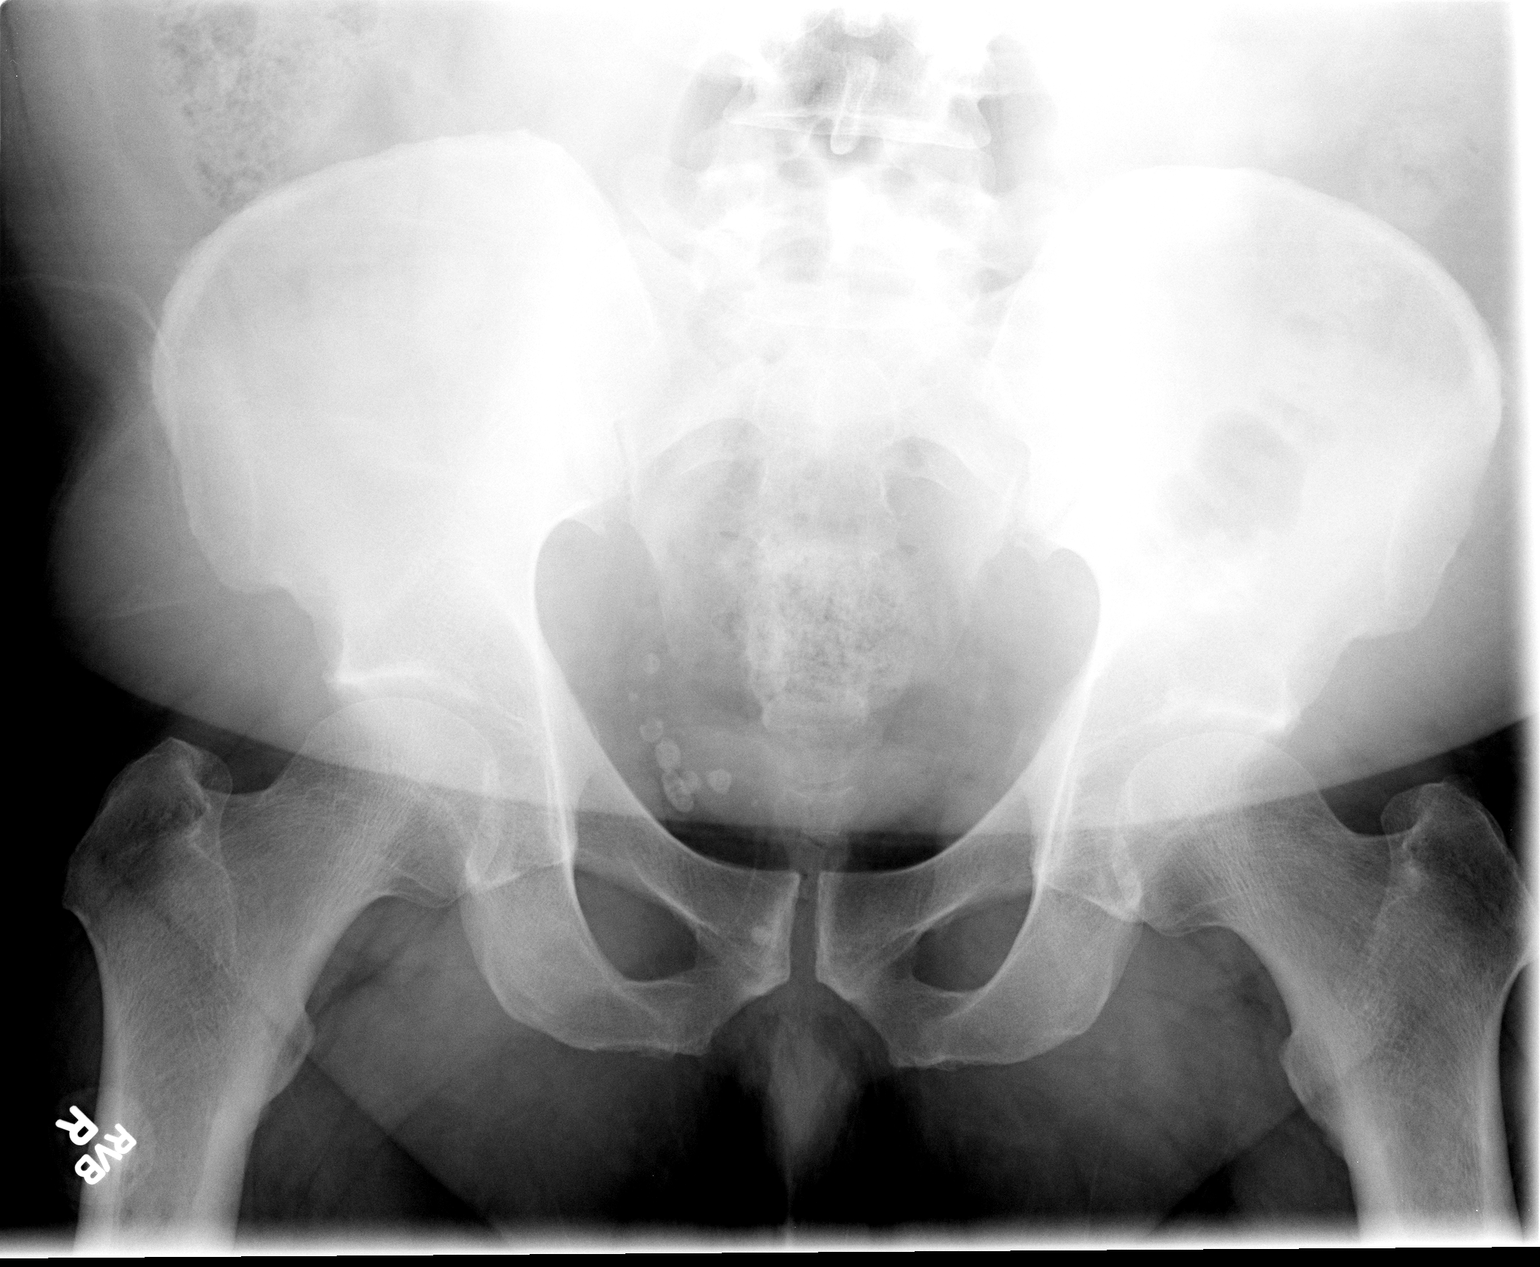

[1 of 1 positions shown; findings below may reference images not displayed]

FINDINGS: No fracture.  No bone lesion.  The hip joints, SI joints
and symphysis pubis are normally spaced and aligned.  The soft
tissues are unremarkable.
IMPRESSION: Normal AP pelvis radiograph.

## 2015-05-14 ENCOUNTER — Other Ambulatory Visit: Payer: Self-pay | Admitting: Physician Assistant

## 2015-09-20 ENCOUNTER — Other Ambulatory Visit: Payer: Self-pay | Admitting: Family Medicine

## 2015-10-02 ENCOUNTER — Other Ambulatory Visit: Payer: Self-pay | Admitting: Family Medicine

## 2016-03-21 DIAGNOSIS — Z3189 Encounter for other procreative management: Secondary | ICD-10-CM | POA: Diagnosis not present

## 2016-03-27 DIAGNOSIS — I10 Essential (primary) hypertension: Secondary | ICD-10-CM | POA: Diagnosis not present

## 2017-05-20 DIAGNOSIS — I10 Essential (primary) hypertension: Secondary | ICD-10-CM | POA: Diagnosis not present

## 2017-05-20 DIAGNOSIS — M109 Gout, unspecified: Secondary | ICD-10-CM | POA: Diagnosis not present

## 2018-05-22 DIAGNOSIS — I10 Essential (primary) hypertension: Secondary | ICD-10-CM | POA: Diagnosis not present

## 2018-05-22 DIAGNOSIS — M109 Gout, unspecified: Secondary | ICD-10-CM | POA: Diagnosis not present

## 2018-05-22 DIAGNOSIS — E782 Mixed hyperlipidemia: Secondary | ICD-10-CM | POA: Diagnosis not present

## 2019-02-11 DIAGNOSIS — E782 Mixed hyperlipidemia: Secondary | ICD-10-CM | POA: Diagnosis not present

## 2019-02-11 DIAGNOSIS — M109 Gout, unspecified: Secondary | ICD-10-CM | POA: Diagnosis not present

## 2019-02-11 DIAGNOSIS — I10 Essential (primary) hypertension: Secondary | ICD-10-CM | POA: Diagnosis not present

## 2020-02-11 DIAGNOSIS — M109 Gout, unspecified: Secondary | ICD-10-CM | POA: Diagnosis not present

## 2020-02-11 DIAGNOSIS — I10 Essential (primary) hypertension: Secondary | ICD-10-CM | POA: Diagnosis not present

## 2020-02-14 DIAGNOSIS — L988 Other specified disorders of the skin and subcutaneous tissue: Secondary | ICD-10-CM | POA: Diagnosis not present

## 2020-04-11 DIAGNOSIS — F1490 Cocaine use, unspecified, uncomplicated: Secondary | ICD-10-CM | POA: Diagnosis not present

## 2020-04-20 DIAGNOSIS — F1490 Cocaine use, unspecified, uncomplicated: Secondary | ICD-10-CM | POA: Diagnosis not present

## 2020-04-26 DIAGNOSIS — F1490 Cocaine use, unspecified, uncomplicated: Secondary | ICD-10-CM | POA: Diagnosis not present

## 2020-05-10 DIAGNOSIS — F1490 Cocaine use, unspecified, uncomplicated: Secondary | ICD-10-CM | POA: Diagnosis not present

## 2020-05-15 DIAGNOSIS — F1490 Cocaine use, unspecified, uncomplicated: Secondary | ICD-10-CM | POA: Diagnosis not present

## 2020-05-23 DIAGNOSIS — F1490 Cocaine use, unspecified, uncomplicated: Secondary | ICD-10-CM | POA: Diagnosis not present

## 2020-06-15 DIAGNOSIS — F1490 Cocaine use, unspecified, uncomplicated: Secondary | ICD-10-CM | POA: Diagnosis not present

## 2020-06-21 DIAGNOSIS — F1490 Cocaine use, unspecified, uncomplicated: Secondary | ICD-10-CM | POA: Diagnosis not present

## 2020-06-28 DIAGNOSIS — F1490 Cocaine use, unspecified, uncomplicated: Secondary | ICD-10-CM | POA: Diagnosis not present

## 2020-07-05 DIAGNOSIS — F1490 Cocaine use, unspecified, uncomplicated: Secondary | ICD-10-CM | POA: Diagnosis not present

## 2020-07-06 DIAGNOSIS — F1490 Cocaine use, unspecified, uncomplicated: Secondary | ICD-10-CM | POA: Diagnosis not present

## 2020-07-13 DIAGNOSIS — F1490 Cocaine use, unspecified, uncomplicated: Secondary | ICD-10-CM | POA: Diagnosis not present

## 2020-07-19 DIAGNOSIS — F1490 Cocaine use, unspecified, uncomplicated: Secondary | ICD-10-CM | POA: Diagnosis not present

## 2020-08-15 DIAGNOSIS — F1490 Cocaine use, unspecified, uncomplicated: Secondary | ICD-10-CM | POA: Diagnosis not present

## 2020-08-18 DIAGNOSIS — F1490 Cocaine use, unspecified, uncomplicated: Secondary | ICD-10-CM | POA: Diagnosis not present

## 2020-08-21 DIAGNOSIS — F1490 Cocaine use, unspecified, uncomplicated: Secondary | ICD-10-CM | POA: Diagnosis not present

## 2020-08-23 ENCOUNTER — Telehealth (HOSPITAL_COMMUNITY): Payer: Self-pay | Admitting: Psychiatry

## 2020-08-23 DIAGNOSIS — F1490 Cocaine use, unspecified, uncomplicated: Secondary | ICD-10-CM | POA: Diagnosis not present

## 2020-08-23 NOTE — Telephone Encounter (Signed)
D:  Pt's wife phoned to inquire about CD-IOP for her husband.  A:  Answered all her questions.  Recommended that patient call case manager back so she could orient him and give him an appt for assessment with Fulton Reek, LCSW, LCAS.  R:  Wife was receptive.

## 2020-08-25 DIAGNOSIS — F1490 Cocaine use, unspecified, uncomplicated: Secondary | ICD-10-CM | POA: Diagnosis not present

## 2020-08-28 ENCOUNTER — Ambulatory Visit (HOSPITAL_COMMUNITY): Payer: Self-pay | Admitting: Licensed Clinical Social Worker

## 2020-11-23 DIAGNOSIS — F1490 Cocaine use, unspecified, uncomplicated: Secondary | ICD-10-CM | POA: Diagnosis not present

## 2020-11-30 DIAGNOSIS — F1490 Cocaine use, unspecified, uncomplicated: Secondary | ICD-10-CM | POA: Diagnosis not present

## 2021-01-27 DIAGNOSIS — I10 Essential (primary) hypertension: Secondary | ICD-10-CM | POA: Diagnosis not present

## 2021-01-27 DIAGNOSIS — R7989 Other specified abnormal findings of blood chemistry: Secondary | ICD-10-CM | POA: Diagnosis not present

## 2021-01-27 DIAGNOSIS — R45851 Suicidal ideations: Secondary | ICD-10-CM | POA: Diagnosis not present

## 2021-01-27 DIAGNOSIS — F1410 Cocaine abuse, uncomplicated: Secondary | ICD-10-CM | POA: Diagnosis not present

## 2021-01-27 DIAGNOSIS — F17290 Nicotine dependence, other tobacco product, uncomplicated: Secondary | ICD-10-CM | POA: Diagnosis not present

## 2021-01-27 DIAGNOSIS — Z79899 Other long term (current) drug therapy: Secondary | ICD-10-CM | POA: Diagnosis not present

## 2021-01-27 DIAGNOSIS — Z7982 Long term (current) use of aspirin: Secondary | ICD-10-CM | POA: Diagnosis not present

## 2021-01-28 DIAGNOSIS — F1414 Cocaine abuse with cocaine-induced mood disorder: Secondary | ICD-10-CM

## 2021-01-28 DIAGNOSIS — R45851 Suicidal ideations: Secondary | ICD-10-CM | POA: Diagnosis not present

## 2021-01-28 DIAGNOSIS — F1420 Cocaine dependence, uncomplicated: Secondary | ICD-10-CM | POA: Insufficient documentation

## 2021-01-28 HISTORY — DX: Cocaine abuse with cocaine-induced mood disorder: F14.14

## 2021-01-28 HISTORY — DX: Cocaine dependence, uncomplicated: F14.20

## 2021-01-29 ENCOUNTER — Telehealth (HOSPITAL_COMMUNITY): Payer: Self-pay | Admitting: Psychiatry

## 2021-01-29 NOTE — Telephone Encounter (Signed)
D:  Pt phoned to inquire about CD-IOP.  A:  Oriented pt.  Patient is scheduled to see Fulton Reek, LCSW, LCAS on 02-05-21 @ 9 a.m.  Instructed pt to arrive at 8:30 a.m in order to complete paperwork, bring insurance and Har[place] a mask.  Provided pt with directions; pt requested case manager to email him all the appt info.  Inform Waldon Merl.  R:  Pt receptive.

## 2021-02-05 ENCOUNTER — Telehealth (HOSPITAL_COMMUNITY): Payer: Self-pay | Admitting: Licensed Clinical Social Worker

## 2021-02-05 ENCOUNTER — Ambulatory Visit (HOSPITAL_COMMUNITY): Payer: Self-pay | Admitting: Licensed Clinical Social Worker

## 2021-02-12 DIAGNOSIS — F1420 Cocaine dependence, uncomplicated: Secondary | ICD-10-CM | POA: Diagnosis not present

## 2021-02-12 DIAGNOSIS — F1210 Cannabis abuse, uncomplicated: Secondary | ICD-10-CM | POA: Diagnosis not present

## 2021-02-12 DIAGNOSIS — F1020 Alcohol dependence, uncomplicated: Secondary | ICD-10-CM | POA: Diagnosis not present

## 2021-02-19 DIAGNOSIS — F1020 Alcohol dependence, uncomplicated: Secondary | ICD-10-CM | POA: Diagnosis not present

## 2021-02-19 DIAGNOSIS — F1420 Cocaine dependence, uncomplicated: Secondary | ICD-10-CM | POA: Diagnosis not present

## 2021-02-19 DIAGNOSIS — F1210 Cannabis abuse, uncomplicated: Secondary | ICD-10-CM | POA: Diagnosis not present

## 2021-02-20 DIAGNOSIS — F1020 Alcohol dependence, uncomplicated: Secondary | ICD-10-CM | POA: Diagnosis not present

## 2021-02-20 DIAGNOSIS — F1420 Cocaine dependence, uncomplicated: Secondary | ICD-10-CM | POA: Diagnosis not present

## 2021-02-20 DIAGNOSIS — F1210 Cannabis abuse, uncomplicated: Secondary | ICD-10-CM | POA: Diagnosis not present

## 2021-03-09 DIAGNOSIS — J069 Acute upper respiratory infection, unspecified: Secondary | ICD-10-CM | POA: Diagnosis not present

## 2021-04-04 ENCOUNTER — Telehealth (HOSPITAL_COMMUNITY): Payer: Self-pay | Admitting: Psychiatry

## 2021-04-04 NOTE — Telephone Encounter (Signed)
D:  Albina Billet (case Production designer, theatre/television/film) at [facility] in [place] called to refer pt.  Pt will be discharging on 04-06-21 from their facility and returning back to the area.  A:  Pt is scheduled to see Fulton Reek, LCSW, LCAS for a CCA on 04-10-21 @ 9 a.m.  Instructed pt to arrive at 8:30 a.m; wearing a mask and bring insurance card.  Inform Waldon Merl.

## 2021-04-10 ENCOUNTER — Telehealth (HOSPITAL_COMMUNITY): Payer: Self-pay | Admitting: Licensed Clinical Social Worker

## 2021-04-10 ENCOUNTER — Ambulatory Visit (INDEPENDENT_AMBULATORY_CARE_PROVIDER_SITE_OTHER): Payer: Self-pay | Admitting: Licensed Clinical Social Worker

## 2021-04-10 DIAGNOSIS — Z7189 Other specified counseling: Secondary | ICD-10-CM

## 2021-04-10 DIAGNOSIS — F1220 Cannabis dependence, uncomplicated: Secondary | ICD-10-CM | POA: Diagnosis not present

## 2021-04-10 DIAGNOSIS — F419 Anxiety disorder, unspecified: Secondary | ICD-10-CM | POA: Diagnosis not present

## 2021-04-10 DIAGNOSIS — I10 Essential (primary) hypertension: Secondary | ICD-10-CM | POA: Diagnosis not present

## 2021-04-10 DIAGNOSIS — F1420 Cocaine dependence, uncomplicated: Secondary | ICD-10-CM | POA: Diagnosis not present

## 2021-04-10 DIAGNOSIS — M1A9XX0 Chronic gout, unspecified, without tophus (tophi): Secondary | ICD-10-CM | POA: Diagnosis not present

## 2021-04-11 NOTE — Progress Notes (Signed)
Client did not attend session. Client is a no show/ no call. Outreach was completed.

## 2021-04-12 DIAGNOSIS — M1A9XX0 Chronic gout, unspecified, without tophus (tophi): Secondary | ICD-10-CM | POA: Diagnosis not present

## 2021-04-12 DIAGNOSIS — F419 Anxiety disorder, unspecified: Secondary | ICD-10-CM | POA: Diagnosis not present

## 2021-04-12 DIAGNOSIS — F1220 Cannabis dependence, uncomplicated: Secondary | ICD-10-CM | POA: Diagnosis not present

## 2021-04-12 DIAGNOSIS — I10 Essential (primary) hypertension: Secondary | ICD-10-CM | POA: Diagnosis not present

## 2021-04-12 DIAGNOSIS — F1420 Cocaine dependence, uncomplicated: Secondary | ICD-10-CM | POA: Diagnosis not present

## 2021-04-12 DIAGNOSIS — E669 Obesity, unspecified: Secondary | ICD-10-CM | POA: Diagnosis not present

## 2021-04-20 DIAGNOSIS — I10 Essential (primary) hypertension: Secondary | ICD-10-CM | POA: Diagnosis not present

## 2021-04-20 DIAGNOSIS — Z03818 Encounter for observation for suspected exposure to other biological agents ruled out: Secondary | ICD-10-CM | POA: Diagnosis not present

## 2021-04-20 DIAGNOSIS — Z20822 Contact with and (suspected) exposure to covid-19: Secondary | ICD-10-CM | POA: Diagnosis not present

## 2021-06-26 DIAGNOSIS — F1490 Cocaine use, unspecified, uncomplicated: Secondary | ICD-10-CM | POA: Diagnosis not present

## 2021-07-05 DIAGNOSIS — F1490 Cocaine use, unspecified, uncomplicated: Secondary | ICD-10-CM | POA: Diagnosis not present

## 2021-07-10 DIAGNOSIS — F1490 Cocaine use, unspecified, uncomplicated: Secondary | ICD-10-CM | POA: Diagnosis not present

## 2021-07-18 DIAGNOSIS — F1490 Cocaine use, unspecified, uncomplicated: Secondary | ICD-10-CM | POA: Diagnosis not present

## 2021-07-26 DIAGNOSIS — F1490 Cocaine use, unspecified, uncomplicated: Secondary | ICD-10-CM | POA: Diagnosis not present

## 2021-07-30 DIAGNOSIS — F1490 Cocaine use, unspecified, uncomplicated: Secondary | ICD-10-CM | POA: Diagnosis not present

## 2021-08-08 DIAGNOSIS — F1490 Cocaine use, unspecified, uncomplicated: Secondary | ICD-10-CM | POA: Diagnosis not present

## 2021-10-09 DIAGNOSIS — F1420 Cocaine dependence, uncomplicated: Secondary | ICD-10-CM | POA: Diagnosis not present

## 2021-10-09 DIAGNOSIS — F1220 Cannabis dependence, uncomplicated: Secondary | ICD-10-CM | POA: Diagnosis not present

## 2021-10-09 DIAGNOSIS — F1020 Alcohol dependence, uncomplicated: Secondary | ICD-10-CM | POA: Diagnosis not present

## 2021-10-10 DIAGNOSIS — F1420 Cocaine dependence, uncomplicated: Secondary | ICD-10-CM | POA: Diagnosis not present

## 2021-10-10 DIAGNOSIS — F1220 Cannabis dependence, uncomplicated: Secondary | ICD-10-CM | POA: Diagnosis not present

## 2021-10-10 DIAGNOSIS — F1020 Alcohol dependence, uncomplicated: Secondary | ICD-10-CM | POA: Diagnosis not present

## 2021-10-11 DIAGNOSIS — F1020 Alcohol dependence, uncomplicated: Secondary | ICD-10-CM | POA: Diagnosis not present

## 2021-10-11 DIAGNOSIS — F1220 Cannabis dependence, uncomplicated: Secondary | ICD-10-CM | POA: Diagnosis not present

## 2021-10-11 DIAGNOSIS — F1420 Cocaine dependence, uncomplicated: Secondary | ICD-10-CM | POA: Diagnosis not present

## 2021-10-12 DIAGNOSIS — F1420 Cocaine dependence, uncomplicated: Secondary | ICD-10-CM | POA: Diagnosis not present

## 2021-10-12 DIAGNOSIS — F1220 Cannabis dependence, uncomplicated: Secondary | ICD-10-CM | POA: Diagnosis not present

## 2021-10-12 DIAGNOSIS — F1020 Alcohol dependence, uncomplicated: Secondary | ICD-10-CM | POA: Diagnosis not present

## 2021-10-13 DIAGNOSIS — F1020 Alcohol dependence, uncomplicated: Secondary | ICD-10-CM | POA: Diagnosis not present

## 2021-10-13 DIAGNOSIS — F1220 Cannabis dependence, uncomplicated: Secondary | ICD-10-CM | POA: Diagnosis not present

## 2021-10-13 DIAGNOSIS — F1420 Cocaine dependence, uncomplicated: Secondary | ICD-10-CM | POA: Diagnosis not present

## 2021-10-15 DIAGNOSIS — F1420 Cocaine dependence, uncomplicated: Secondary | ICD-10-CM | POA: Diagnosis not present

## 2021-10-15 DIAGNOSIS — F1220 Cannabis dependence, uncomplicated: Secondary | ICD-10-CM | POA: Diagnosis not present

## 2021-10-15 DIAGNOSIS — F1020 Alcohol dependence, uncomplicated: Secondary | ICD-10-CM | POA: Diagnosis not present

## 2021-10-16 DIAGNOSIS — F1020 Alcohol dependence, uncomplicated: Secondary | ICD-10-CM | POA: Diagnosis not present

## 2021-10-16 DIAGNOSIS — F1420 Cocaine dependence, uncomplicated: Secondary | ICD-10-CM | POA: Diagnosis not present

## 2021-10-16 DIAGNOSIS — F1220 Cannabis dependence, uncomplicated: Secondary | ICD-10-CM | POA: Diagnosis not present

## 2021-10-17 DIAGNOSIS — F1020 Alcohol dependence, uncomplicated: Secondary | ICD-10-CM | POA: Diagnosis not present

## 2021-10-17 DIAGNOSIS — F1420 Cocaine dependence, uncomplicated: Secondary | ICD-10-CM | POA: Diagnosis not present

## 2021-10-17 DIAGNOSIS — F1220 Cannabis dependence, uncomplicated: Secondary | ICD-10-CM | POA: Diagnosis not present

## 2021-10-18 DIAGNOSIS — F1220 Cannabis dependence, uncomplicated: Secondary | ICD-10-CM | POA: Diagnosis not present

## 2021-10-18 DIAGNOSIS — F1420 Cocaine dependence, uncomplicated: Secondary | ICD-10-CM | POA: Diagnosis not present

## 2021-10-18 DIAGNOSIS — F1020 Alcohol dependence, uncomplicated: Secondary | ICD-10-CM | POA: Diagnosis not present

## 2021-10-20 DIAGNOSIS — F1220 Cannabis dependence, uncomplicated: Secondary | ICD-10-CM | POA: Diagnosis not present

## 2021-10-20 DIAGNOSIS — F1020 Alcohol dependence, uncomplicated: Secondary | ICD-10-CM | POA: Diagnosis not present

## 2021-10-20 DIAGNOSIS — F1420 Cocaine dependence, uncomplicated: Secondary | ICD-10-CM | POA: Diagnosis not present

## 2021-10-22 DIAGNOSIS — F1420 Cocaine dependence, uncomplicated: Secondary | ICD-10-CM | POA: Diagnosis not present

## 2021-10-22 DIAGNOSIS — F1220 Cannabis dependence, uncomplicated: Secondary | ICD-10-CM | POA: Diagnosis not present

## 2021-10-22 DIAGNOSIS — F1020 Alcohol dependence, uncomplicated: Secondary | ICD-10-CM | POA: Diagnosis not present

## 2021-10-23 DIAGNOSIS — F1220 Cannabis dependence, uncomplicated: Secondary | ICD-10-CM | POA: Diagnosis not present

## 2021-10-23 DIAGNOSIS — F1420 Cocaine dependence, uncomplicated: Secondary | ICD-10-CM | POA: Diagnosis not present

## 2021-10-23 DIAGNOSIS — F1020 Alcohol dependence, uncomplicated: Secondary | ICD-10-CM | POA: Diagnosis not present

## 2021-10-24 DIAGNOSIS — F1020 Alcohol dependence, uncomplicated: Secondary | ICD-10-CM | POA: Diagnosis not present

## 2021-10-24 DIAGNOSIS — F1420 Cocaine dependence, uncomplicated: Secondary | ICD-10-CM | POA: Diagnosis not present

## 2021-10-24 DIAGNOSIS — F1220 Cannabis dependence, uncomplicated: Secondary | ICD-10-CM | POA: Diagnosis not present

## 2021-10-25 DIAGNOSIS — F1020 Alcohol dependence, uncomplicated: Secondary | ICD-10-CM | POA: Diagnosis not present

## 2021-10-25 DIAGNOSIS — F1220 Cannabis dependence, uncomplicated: Secondary | ICD-10-CM | POA: Diagnosis not present

## 2021-10-25 DIAGNOSIS — F1420 Cocaine dependence, uncomplicated: Secondary | ICD-10-CM | POA: Diagnosis not present

## 2021-10-26 DIAGNOSIS — F1420 Cocaine dependence, uncomplicated: Secondary | ICD-10-CM | POA: Diagnosis not present

## 2021-10-26 DIAGNOSIS — F1220 Cannabis dependence, uncomplicated: Secondary | ICD-10-CM | POA: Diagnosis not present

## 2021-10-26 DIAGNOSIS — F1020 Alcohol dependence, uncomplicated: Secondary | ICD-10-CM | POA: Diagnosis not present

## 2021-10-27 DIAGNOSIS — F1020 Alcohol dependence, uncomplicated: Secondary | ICD-10-CM | POA: Diagnosis not present

## 2021-10-27 DIAGNOSIS — F1220 Cannabis dependence, uncomplicated: Secondary | ICD-10-CM | POA: Diagnosis not present

## 2021-10-27 DIAGNOSIS — F1420 Cocaine dependence, uncomplicated: Secondary | ICD-10-CM | POA: Diagnosis not present

## 2021-10-29 DIAGNOSIS — F1220 Cannabis dependence, uncomplicated: Secondary | ICD-10-CM | POA: Diagnosis not present

## 2021-10-29 DIAGNOSIS — F1420 Cocaine dependence, uncomplicated: Secondary | ICD-10-CM | POA: Diagnosis not present

## 2021-10-29 DIAGNOSIS — F1020 Alcohol dependence, uncomplicated: Secondary | ICD-10-CM | POA: Diagnosis not present

## 2021-10-30 DIAGNOSIS — F1020 Alcohol dependence, uncomplicated: Secondary | ICD-10-CM | POA: Diagnosis not present

## 2021-10-30 DIAGNOSIS — F1220 Cannabis dependence, uncomplicated: Secondary | ICD-10-CM | POA: Diagnosis not present

## 2021-10-30 DIAGNOSIS — F1420 Cocaine dependence, uncomplicated: Secondary | ICD-10-CM | POA: Diagnosis not present

## 2021-10-31 DIAGNOSIS — F1420 Cocaine dependence, uncomplicated: Secondary | ICD-10-CM | POA: Diagnosis not present

## 2021-10-31 DIAGNOSIS — F1220 Cannabis dependence, uncomplicated: Secondary | ICD-10-CM | POA: Diagnosis not present

## 2021-10-31 DIAGNOSIS — M25562 Pain in left knee: Secondary | ICD-10-CM | POA: Diagnosis not present

## 2021-10-31 DIAGNOSIS — F1020 Alcohol dependence, uncomplicated: Secondary | ICD-10-CM | POA: Diagnosis not present

## 2021-11-01 DIAGNOSIS — F1020 Alcohol dependence, uncomplicated: Secondary | ICD-10-CM | POA: Diagnosis not present

## 2021-11-01 DIAGNOSIS — F1220 Cannabis dependence, uncomplicated: Secondary | ICD-10-CM | POA: Diagnosis not present

## 2021-11-01 DIAGNOSIS — F1420 Cocaine dependence, uncomplicated: Secondary | ICD-10-CM | POA: Diagnosis not present

## 2021-11-02 DIAGNOSIS — F1220 Cannabis dependence, uncomplicated: Secondary | ICD-10-CM | POA: Diagnosis not present

## 2021-11-02 DIAGNOSIS — F1420 Cocaine dependence, uncomplicated: Secondary | ICD-10-CM | POA: Diagnosis not present

## 2021-11-02 DIAGNOSIS — F1020 Alcohol dependence, uncomplicated: Secondary | ICD-10-CM | POA: Diagnosis not present

## 2021-11-03 DIAGNOSIS — F1420 Cocaine dependence, uncomplicated: Secondary | ICD-10-CM | POA: Diagnosis not present

## 2021-11-03 DIAGNOSIS — F1220 Cannabis dependence, uncomplicated: Secondary | ICD-10-CM | POA: Diagnosis not present

## 2021-11-03 DIAGNOSIS — F1020 Alcohol dependence, uncomplicated: Secondary | ICD-10-CM | POA: Diagnosis not present

## 2021-11-05 DIAGNOSIS — F1420 Cocaine dependence, uncomplicated: Secondary | ICD-10-CM | POA: Diagnosis not present

## 2021-11-05 DIAGNOSIS — F1020 Alcohol dependence, uncomplicated: Secondary | ICD-10-CM | POA: Diagnosis not present

## 2021-11-05 DIAGNOSIS — F1220 Cannabis dependence, uncomplicated: Secondary | ICD-10-CM | POA: Diagnosis not present

## 2021-11-06 DIAGNOSIS — F1420 Cocaine dependence, uncomplicated: Secondary | ICD-10-CM | POA: Diagnosis not present

## 2021-11-06 DIAGNOSIS — F1020 Alcohol dependence, uncomplicated: Secondary | ICD-10-CM | POA: Diagnosis not present

## 2021-11-06 DIAGNOSIS — F1220 Cannabis dependence, uncomplicated: Secondary | ICD-10-CM | POA: Diagnosis not present

## 2021-11-09 DIAGNOSIS — F1420 Cocaine dependence, uncomplicated: Secondary | ICD-10-CM | POA: Diagnosis not present

## 2021-11-09 DIAGNOSIS — F1220 Cannabis dependence, uncomplicated: Secondary | ICD-10-CM | POA: Diagnosis not present

## 2021-11-09 DIAGNOSIS — F1020 Alcohol dependence, uncomplicated: Secondary | ICD-10-CM | POA: Diagnosis not present

## 2021-11-12 DIAGNOSIS — F1420 Cocaine dependence, uncomplicated: Secondary | ICD-10-CM | POA: Diagnosis not present

## 2021-11-12 DIAGNOSIS — F1220 Cannabis dependence, uncomplicated: Secondary | ICD-10-CM | POA: Diagnosis not present

## 2021-11-12 DIAGNOSIS — F1020 Alcohol dependence, uncomplicated: Secondary | ICD-10-CM | POA: Diagnosis not present

## 2021-11-13 DIAGNOSIS — F1020 Alcohol dependence, uncomplicated: Secondary | ICD-10-CM | POA: Diagnosis not present

## 2021-11-13 DIAGNOSIS — F1220 Cannabis dependence, uncomplicated: Secondary | ICD-10-CM | POA: Diagnosis not present

## 2021-11-13 DIAGNOSIS — F1420 Cocaine dependence, uncomplicated: Secondary | ICD-10-CM | POA: Diagnosis not present

## 2021-11-14 DIAGNOSIS — F1420 Cocaine dependence, uncomplicated: Secondary | ICD-10-CM | POA: Diagnosis not present

## 2021-11-14 DIAGNOSIS — F1020 Alcohol dependence, uncomplicated: Secondary | ICD-10-CM | POA: Diagnosis not present

## 2021-11-14 DIAGNOSIS — F1220 Cannabis dependence, uncomplicated: Secondary | ICD-10-CM | POA: Diagnosis not present

## 2021-11-15 DIAGNOSIS — F1020 Alcohol dependence, uncomplicated: Secondary | ICD-10-CM | POA: Diagnosis not present

## 2021-11-15 DIAGNOSIS — F1420 Cocaine dependence, uncomplicated: Secondary | ICD-10-CM | POA: Diagnosis not present

## 2021-11-15 DIAGNOSIS — F1220 Cannabis dependence, uncomplicated: Secondary | ICD-10-CM | POA: Diagnosis not present

## 2021-11-16 DIAGNOSIS — F1420 Cocaine dependence, uncomplicated: Secondary | ICD-10-CM | POA: Diagnosis not present

## 2021-11-16 DIAGNOSIS — F1220 Cannabis dependence, uncomplicated: Secondary | ICD-10-CM | POA: Diagnosis not present

## 2021-11-16 DIAGNOSIS — F1020 Alcohol dependence, uncomplicated: Secondary | ICD-10-CM | POA: Diagnosis not present

## 2021-11-19 DIAGNOSIS — F1420 Cocaine dependence, uncomplicated: Secondary | ICD-10-CM | POA: Diagnosis not present

## 2021-11-19 DIAGNOSIS — F1020 Alcohol dependence, uncomplicated: Secondary | ICD-10-CM | POA: Diagnosis not present

## 2021-11-19 DIAGNOSIS — F1220 Cannabis dependence, uncomplicated: Secondary | ICD-10-CM | POA: Diagnosis not present

## 2021-11-21 DIAGNOSIS — F1420 Cocaine dependence, uncomplicated: Secondary | ICD-10-CM | POA: Diagnosis not present

## 2021-11-21 DIAGNOSIS — F1020 Alcohol dependence, uncomplicated: Secondary | ICD-10-CM | POA: Diagnosis not present

## 2021-11-21 DIAGNOSIS — F1220 Cannabis dependence, uncomplicated: Secondary | ICD-10-CM | POA: Diagnosis not present

## 2021-11-22 DIAGNOSIS — F1020 Alcohol dependence, uncomplicated: Secondary | ICD-10-CM | POA: Diagnosis not present

## 2021-11-22 DIAGNOSIS — F1220 Cannabis dependence, uncomplicated: Secondary | ICD-10-CM | POA: Diagnosis not present

## 2021-11-22 DIAGNOSIS — F1420 Cocaine dependence, uncomplicated: Secondary | ICD-10-CM | POA: Diagnosis not present

## 2021-11-23 DIAGNOSIS — F1020 Alcohol dependence, uncomplicated: Secondary | ICD-10-CM | POA: Diagnosis not present

## 2021-11-23 DIAGNOSIS — F1220 Cannabis dependence, uncomplicated: Secondary | ICD-10-CM | POA: Diagnosis not present

## 2021-11-23 DIAGNOSIS — F1420 Cocaine dependence, uncomplicated: Secondary | ICD-10-CM | POA: Diagnosis not present

## 2021-12-10 DIAGNOSIS — Z20822 Contact with and (suspected) exposure to covid-19: Secondary | ICD-10-CM | POA: Diagnosis not present

## 2021-12-11 DIAGNOSIS — F1020 Alcohol dependence, uncomplicated: Secondary | ICD-10-CM | POA: Diagnosis not present

## 2021-12-11 DIAGNOSIS — F1420 Cocaine dependence, uncomplicated: Secondary | ICD-10-CM | POA: Diagnosis not present

## 2021-12-11 DIAGNOSIS — F1220 Cannabis dependence, uncomplicated: Secondary | ICD-10-CM | POA: Diagnosis not present

## 2021-12-12 DIAGNOSIS — F1020 Alcohol dependence, uncomplicated: Secondary | ICD-10-CM | POA: Diagnosis not present

## 2021-12-12 DIAGNOSIS — F1420 Cocaine dependence, uncomplicated: Secondary | ICD-10-CM | POA: Diagnosis not present

## 2021-12-12 DIAGNOSIS — F1220 Cannabis dependence, uncomplicated: Secondary | ICD-10-CM | POA: Diagnosis not present

## 2021-12-13 DIAGNOSIS — F1420 Cocaine dependence, uncomplicated: Secondary | ICD-10-CM | POA: Diagnosis not present

## 2021-12-13 DIAGNOSIS — F1220 Cannabis dependence, uncomplicated: Secondary | ICD-10-CM | POA: Diagnosis not present

## 2021-12-13 DIAGNOSIS — F1020 Alcohol dependence, uncomplicated: Secondary | ICD-10-CM | POA: Diagnosis not present

## 2021-12-14 DIAGNOSIS — F1220 Cannabis dependence, uncomplicated: Secondary | ICD-10-CM | POA: Diagnosis not present

## 2021-12-14 DIAGNOSIS — F1420 Cocaine dependence, uncomplicated: Secondary | ICD-10-CM | POA: Diagnosis not present

## 2021-12-14 DIAGNOSIS — F1020 Alcohol dependence, uncomplicated: Secondary | ICD-10-CM | POA: Diagnosis not present

## 2021-12-15 DIAGNOSIS — F1220 Cannabis dependence, uncomplicated: Secondary | ICD-10-CM | POA: Diagnosis not present

## 2021-12-15 DIAGNOSIS — F1420 Cocaine dependence, uncomplicated: Secondary | ICD-10-CM | POA: Diagnosis not present

## 2021-12-15 DIAGNOSIS — F1020 Alcohol dependence, uncomplicated: Secondary | ICD-10-CM | POA: Diagnosis not present

## 2021-12-17 DIAGNOSIS — F1020 Alcohol dependence, uncomplicated: Secondary | ICD-10-CM | POA: Diagnosis not present

## 2021-12-17 DIAGNOSIS — F1420 Cocaine dependence, uncomplicated: Secondary | ICD-10-CM | POA: Diagnosis not present

## 2021-12-18 DIAGNOSIS — F1020 Alcohol dependence, uncomplicated: Secondary | ICD-10-CM | POA: Diagnosis not present

## 2021-12-18 DIAGNOSIS — F1420 Cocaine dependence, uncomplicated: Secondary | ICD-10-CM | POA: Diagnosis not present

## 2021-12-19 DIAGNOSIS — F1420 Cocaine dependence, uncomplicated: Secondary | ICD-10-CM | POA: Diagnosis not present

## 2021-12-19 DIAGNOSIS — F1020 Alcohol dependence, uncomplicated: Secondary | ICD-10-CM | POA: Diagnosis not present

## 2021-12-20 DIAGNOSIS — F1020 Alcohol dependence, uncomplicated: Secondary | ICD-10-CM | POA: Diagnosis not present

## 2021-12-20 DIAGNOSIS — F1420 Cocaine dependence, uncomplicated: Secondary | ICD-10-CM | POA: Diagnosis not present

## 2021-12-21 DIAGNOSIS — F1020 Alcohol dependence, uncomplicated: Secondary | ICD-10-CM | POA: Diagnosis not present

## 2021-12-21 DIAGNOSIS — F1420 Cocaine dependence, uncomplicated: Secondary | ICD-10-CM | POA: Diagnosis not present

## 2021-12-22 DIAGNOSIS — F1020 Alcohol dependence, uncomplicated: Secondary | ICD-10-CM | POA: Diagnosis not present

## 2021-12-22 DIAGNOSIS — F1420 Cocaine dependence, uncomplicated: Secondary | ICD-10-CM | POA: Diagnosis not present

## 2021-12-24 DIAGNOSIS — F1020 Alcohol dependence, uncomplicated: Secondary | ICD-10-CM | POA: Diagnosis not present

## 2021-12-24 DIAGNOSIS — F1420 Cocaine dependence, uncomplicated: Secondary | ICD-10-CM | POA: Diagnosis not present

## 2021-12-25 DIAGNOSIS — F1420 Cocaine dependence, uncomplicated: Secondary | ICD-10-CM | POA: Diagnosis not present

## 2021-12-25 DIAGNOSIS — F1020 Alcohol dependence, uncomplicated: Secondary | ICD-10-CM | POA: Diagnosis not present

## 2021-12-26 DIAGNOSIS — F1020 Alcohol dependence, uncomplicated: Secondary | ICD-10-CM | POA: Diagnosis not present

## 2021-12-26 DIAGNOSIS — F1420 Cocaine dependence, uncomplicated: Secondary | ICD-10-CM | POA: Diagnosis not present

## 2021-12-27 DIAGNOSIS — F1420 Cocaine dependence, uncomplicated: Secondary | ICD-10-CM | POA: Diagnosis not present

## 2021-12-27 DIAGNOSIS — F1020 Alcohol dependence, uncomplicated: Secondary | ICD-10-CM | POA: Diagnosis not present

## 2021-12-28 DIAGNOSIS — F1420 Cocaine dependence, uncomplicated: Secondary | ICD-10-CM | POA: Diagnosis not present

## 2021-12-28 DIAGNOSIS — F1020 Alcohol dependence, uncomplicated: Secondary | ICD-10-CM | POA: Diagnosis not present

## 2021-12-31 DIAGNOSIS — F1020 Alcohol dependence, uncomplicated: Secondary | ICD-10-CM | POA: Diagnosis not present

## 2021-12-31 DIAGNOSIS — F1420 Cocaine dependence, uncomplicated: Secondary | ICD-10-CM | POA: Diagnosis not present

## 2022-01-01 DIAGNOSIS — F1020 Alcohol dependence, uncomplicated: Secondary | ICD-10-CM | POA: Diagnosis not present

## 2022-01-01 DIAGNOSIS — F1420 Cocaine dependence, uncomplicated: Secondary | ICD-10-CM | POA: Diagnosis not present

## 2022-01-02 DIAGNOSIS — F1020 Alcohol dependence, uncomplicated: Secondary | ICD-10-CM | POA: Diagnosis not present

## 2022-01-02 DIAGNOSIS — F1420 Cocaine dependence, uncomplicated: Secondary | ICD-10-CM | POA: Diagnosis not present

## 2022-01-03 DIAGNOSIS — F1020 Alcohol dependence, uncomplicated: Secondary | ICD-10-CM | POA: Diagnosis not present

## 2022-01-03 DIAGNOSIS — F1420 Cocaine dependence, uncomplicated: Secondary | ICD-10-CM | POA: Diagnosis not present

## 2022-01-04 DIAGNOSIS — F1420 Cocaine dependence, uncomplicated: Secondary | ICD-10-CM | POA: Diagnosis not present

## 2022-01-04 DIAGNOSIS — F1020 Alcohol dependence, uncomplicated: Secondary | ICD-10-CM | POA: Diagnosis not present

## 2022-01-05 DIAGNOSIS — F1020 Alcohol dependence, uncomplicated: Secondary | ICD-10-CM | POA: Diagnosis not present

## 2022-01-05 DIAGNOSIS — F1420 Cocaine dependence, uncomplicated: Secondary | ICD-10-CM | POA: Diagnosis not present

## 2022-01-07 DIAGNOSIS — F1420 Cocaine dependence, uncomplicated: Secondary | ICD-10-CM | POA: Diagnosis not present

## 2022-01-07 DIAGNOSIS — F1020 Alcohol dependence, uncomplicated: Secondary | ICD-10-CM | POA: Diagnosis not present

## 2022-01-08 DIAGNOSIS — F1420 Cocaine dependence, uncomplicated: Secondary | ICD-10-CM | POA: Diagnosis not present

## 2022-01-08 DIAGNOSIS — F1020 Alcohol dependence, uncomplicated: Secondary | ICD-10-CM | POA: Diagnosis not present

## 2022-01-09 DIAGNOSIS — F1020 Alcohol dependence, uncomplicated: Secondary | ICD-10-CM | POA: Diagnosis not present

## 2022-01-09 DIAGNOSIS — F1420 Cocaine dependence, uncomplicated: Secondary | ICD-10-CM | POA: Diagnosis not present

## 2022-01-10 DIAGNOSIS — F1020 Alcohol dependence, uncomplicated: Secondary | ICD-10-CM | POA: Diagnosis not present

## 2022-01-10 DIAGNOSIS — F1420 Cocaine dependence, uncomplicated: Secondary | ICD-10-CM | POA: Diagnosis not present

## 2022-01-11 DIAGNOSIS — F1020 Alcohol dependence, uncomplicated: Secondary | ICD-10-CM | POA: Diagnosis not present

## 2022-01-11 DIAGNOSIS — F1420 Cocaine dependence, uncomplicated: Secondary | ICD-10-CM | POA: Diagnosis not present

## 2022-01-12 DIAGNOSIS — F1020 Alcohol dependence, uncomplicated: Secondary | ICD-10-CM | POA: Diagnosis not present

## 2022-01-12 DIAGNOSIS — F1420 Cocaine dependence, uncomplicated: Secondary | ICD-10-CM | POA: Diagnosis not present

## 2022-01-14 DIAGNOSIS — F1020 Alcohol dependence, uncomplicated: Secondary | ICD-10-CM | POA: Diagnosis not present

## 2022-01-14 DIAGNOSIS — F1420 Cocaine dependence, uncomplicated: Secondary | ICD-10-CM | POA: Diagnosis not present

## 2022-01-15 DIAGNOSIS — F1020 Alcohol dependence, uncomplicated: Secondary | ICD-10-CM | POA: Diagnosis not present

## 2022-01-15 DIAGNOSIS — F1420 Cocaine dependence, uncomplicated: Secondary | ICD-10-CM | POA: Diagnosis not present

## 2022-01-16 DIAGNOSIS — F1020 Alcohol dependence, uncomplicated: Secondary | ICD-10-CM | POA: Diagnosis not present

## 2022-01-16 DIAGNOSIS — F1420 Cocaine dependence, uncomplicated: Secondary | ICD-10-CM | POA: Diagnosis not present

## 2022-01-17 DIAGNOSIS — F1020 Alcohol dependence, uncomplicated: Secondary | ICD-10-CM | POA: Diagnosis not present

## 2022-01-17 DIAGNOSIS — F1420 Cocaine dependence, uncomplicated: Secondary | ICD-10-CM | POA: Diagnosis not present

## 2022-01-18 DIAGNOSIS — F1420 Cocaine dependence, uncomplicated: Secondary | ICD-10-CM | POA: Diagnosis not present

## 2022-01-18 DIAGNOSIS — F1020 Alcohol dependence, uncomplicated: Secondary | ICD-10-CM | POA: Diagnosis not present

## 2022-01-21 DIAGNOSIS — F1420 Cocaine dependence, uncomplicated: Secondary | ICD-10-CM | POA: Diagnosis not present

## 2022-01-21 DIAGNOSIS — F1020 Alcohol dependence, uncomplicated: Secondary | ICD-10-CM | POA: Diagnosis not present

## 2022-01-22 DIAGNOSIS — F1420 Cocaine dependence, uncomplicated: Secondary | ICD-10-CM | POA: Diagnosis not present

## 2022-01-22 DIAGNOSIS — F1020 Alcohol dependence, uncomplicated: Secondary | ICD-10-CM | POA: Diagnosis not present

## 2022-01-23 DIAGNOSIS — F1420 Cocaine dependence, uncomplicated: Secondary | ICD-10-CM | POA: Diagnosis not present

## 2022-01-23 DIAGNOSIS — F1020 Alcohol dependence, uncomplicated: Secondary | ICD-10-CM | POA: Diagnosis not present

## 2022-01-24 DIAGNOSIS — F1020 Alcohol dependence, uncomplicated: Secondary | ICD-10-CM | POA: Diagnosis not present

## 2022-01-24 DIAGNOSIS — F1420 Cocaine dependence, uncomplicated: Secondary | ICD-10-CM | POA: Diagnosis not present

## 2022-01-25 DIAGNOSIS — F1420 Cocaine dependence, uncomplicated: Secondary | ICD-10-CM | POA: Diagnosis not present

## 2022-01-25 DIAGNOSIS — F1020 Alcohol dependence, uncomplicated: Secondary | ICD-10-CM | POA: Diagnosis not present

## 2022-01-29 DIAGNOSIS — F1020 Alcohol dependence, uncomplicated: Secondary | ICD-10-CM | POA: Diagnosis not present

## 2022-01-29 DIAGNOSIS — F1420 Cocaine dependence, uncomplicated: Secondary | ICD-10-CM | POA: Diagnosis not present

## 2022-01-30 DIAGNOSIS — F1420 Cocaine dependence, uncomplicated: Secondary | ICD-10-CM | POA: Diagnosis not present

## 2022-01-30 DIAGNOSIS — F1020 Alcohol dependence, uncomplicated: Secondary | ICD-10-CM | POA: Diagnosis not present

## 2022-01-31 DIAGNOSIS — F1020 Alcohol dependence, uncomplicated: Secondary | ICD-10-CM | POA: Diagnosis not present

## 2022-01-31 DIAGNOSIS — F1420 Cocaine dependence, uncomplicated: Secondary | ICD-10-CM | POA: Diagnosis not present

## 2022-02-01 DIAGNOSIS — F1420 Cocaine dependence, uncomplicated: Secondary | ICD-10-CM | POA: Diagnosis not present

## 2022-02-01 DIAGNOSIS — F1020 Alcohol dependence, uncomplicated: Secondary | ICD-10-CM | POA: Diagnosis not present

## 2022-02-04 DIAGNOSIS — F1420 Cocaine dependence, uncomplicated: Secondary | ICD-10-CM | POA: Diagnosis not present

## 2022-02-04 DIAGNOSIS — F1020 Alcohol dependence, uncomplicated: Secondary | ICD-10-CM | POA: Diagnosis not present

## 2022-02-05 DIAGNOSIS — F1020 Alcohol dependence, uncomplicated: Secondary | ICD-10-CM | POA: Diagnosis not present

## 2022-02-05 DIAGNOSIS — F1420 Cocaine dependence, uncomplicated: Secondary | ICD-10-CM | POA: Diagnosis not present

## 2022-02-07 DIAGNOSIS — F1020 Alcohol dependence, uncomplicated: Secondary | ICD-10-CM | POA: Diagnosis not present

## 2022-02-07 DIAGNOSIS — F1420 Cocaine dependence, uncomplicated: Secondary | ICD-10-CM | POA: Diagnosis not present

## 2022-02-08 DIAGNOSIS — F1420 Cocaine dependence, uncomplicated: Secondary | ICD-10-CM | POA: Diagnosis not present

## 2022-02-08 DIAGNOSIS — F1020 Alcohol dependence, uncomplicated: Secondary | ICD-10-CM | POA: Diagnosis not present

## 2022-02-11 DIAGNOSIS — F1020 Alcohol dependence, uncomplicated: Secondary | ICD-10-CM | POA: Diagnosis not present

## 2022-02-11 DIAGNOSIS — F1420 Cocaine dependence, uncomplicated: Secondary | ICD-10-CM | POA: Diagnosis not present

## 2022-02-12 DIAGNOSIS — F1420 Cocaine dependence, uncomplicated: Secondary | ICD-10-CM | POA: Diagnosis not present

## 2022-02-12 DIAGNOSIS — F1020 Alcohol dependence, uncomplicated: Secondary | ICD-10-CM | POA: Diagnosis not present

## 2022-02-13 DIAGNOSIS — F1020 Alcohol dependence, uncomplicated: Secondary | ICD-10-CM | POA: Diagnosis not present

## 2022-02-13 DIAGNOSIS — F1420 Cocaine dependence, uncomplicated: Secondary | ICD-10-CM | POA: Diagnosis not present

## 2022-02-14 DIAGNOSIS — F1020 Alcohol dependence, uncomplicated: Secondary | ICD-10-CM | POA: Diagnosis not present

## 2022-02-14 DIAGNOSIS — F1420 Cocaine dependence, uncomplicated: Secondary | ICD-10-CM | POA: Diagnosis not present

## 2022-02-15 DIAGNOSIS — Z7982 Long term (current) use of aspirin: Secondary | ICD-10-CM | POA: Diagnosis not present

## 2022-02-15 DIAGNOSIS — R001 Bradycardia, unspecified: Secondary | ICD-10-CM | POA: Diagnosis not present

## 2022-02-15 DIAGNOSIS — N201 Calculus of ureter: Secondary | ICD-10-CM | POA: Diagnosis not present

## 2022-02-15 DIAGNOSIS — Z87442 Personal history of urinary calculi: Secondary | ICD-10-CM | POA: Diagnosis not present

## 2022-02-15 DIAGNOSIS — I471 Supraventricular tachycardia: Secondary | ICD-10-CM | POA: Diagnosis not present

## 2022-02-15 DIAGNOSIS — N1330 Unspecified hydronephrosis: Secondary | ICD-10-CM | POA: Diagnosis not present

## 2022-02-15 DIAGNOSIS — M549 Dorsalgia, unspecified: Secondary | ICD-10-CM | POA: Diagnosis not present

## 2022-02-15 DIAGNOSIS — N132 Hydronephrosis with renal and ureteral calculous obstruction: Secondary | ICD-10-CM | POA: Diagnosis not present

## 2022-02-15 DIAGNOSIS — K8020 Calculus of gallbladder without cholecystitis without obstruction: Secondary | ICD-10-CM | POA: Diagnosis not present

## 2022-02-15 DIAGNOSIS — Z79899 Other long term (current) drug therapy: Secondary | ICD-10-CM | POA: Diagnosis not present

## 2022-02-15 DIAGNOSIS — I10 Essential (primary) hypertension: Secondary | ICD-10-CM | POA: Diagnosis not present

## 2022-02-15 DIAGNOSIS — N200 Calculus of kidney: Secondary | ICD-10-CM

## 2022-02-15 HISTORY — DX: Calculus of kidney: N20.0

## 2022-02-19 DIAGNOSIS — F1020 Alcohol dependence, uncomplicated: Secondary | ICD-10-CM | POA: Diagnosis not present

## 2022-02-19 DIAGNOSIS — F1420 Cocaine dependence, uncomplicated: Secondary | ICD-10-CM | POA: Diagnosis not present

## 2022-05-12 ENCOUNTER — Other Ambulatory Visit: Payer: Self-pay

## 2022-05-12 ENCOUNTER — Emergency Department (HOSPITAL_BASED_OUTPATIENT_CLINIC_OR_DEPARTMENT_OTHER)
Admission: EM | Admit: 2022-05-12 | Discharge: 2022-05-12 | Disposition: A | Discharge status: Home / Self Care | Payer: BC Managed Care – PPO | Attending: Emergency Medicine | Admitting: Emergency Medicine

## 2022-05-12 ENCOUNTER — Encounter (HOSPITAL_BASED_OUTPATIENT_CLINIC_OR_DEPARTMENT_OTHER): Payer: Self-pay

## 2022-05-12 DIAGNOSIS — Z0283 Encounter for blood-alcohol and blood-drug test: Secondary | ICD-10-CM | POA: Diagnosis not present

## 2022-05-12 DIAGNOSIS — Z711 Person with feared health complaint in whom no diagnosis is made: Secondary | ICD-10-CM

## 2022-05-12 DIAGNOSIS — Z7982 Long term (current) use of aspirin: Secondary | ICD-10-CM | POA: Insufficient documentation

## 2022-05-12 DIAGNOSIS — I10 Essential (primary) hypertension: Secondary | ICD-10-CM | POA: Diagnosis not present

## 2022-05-12 DIAGNOSIS — Z79899 Other long term (current) drug therapy: Secondary | ICD-10-CM | POA: Insufficient documentation

## 2022-05-12 LAB — RAPID URINE DRUG SCREEN, HOSP PERFORMED
Amphetamines: NOT DETECTED
Barbiturates: NOT DETECTED
Benzodiazepines: NOT DETECTED
Cocaine: NOT DETECTED
Opiates: NOT DETECTED
Tetrahydrocannabinol: POSITIVE — AB

## 2022-05-12 NOTE — ED Provider Notes (Signed)
  [facility] EMERGENCY DEPT Provider Note   CSN: [id number] Arrival date & time: 05/12/22  2113     History {Add pertinent medical, surgical, social history, OB history to HPI:1} Chief Complaint  Patient presents with  . Labs Only    Tyrone Barnett is a 51 y.o. male.  HPI    Past Medical History:  Diagnosis Date  . Hypertension   . Obesity   . Palpitations   . Renal disorder    kidney stones  . SVT (supraventricular tachycardia) (HCC)     Home Medications Prior to Admission medications   Medication Sig Start Date End Date Taking? Authorizing Provider  allopurinol (ZYLOPRIM) 100 MG tablet TAKE ONE TABLET BY MOUTH ONCE DAILY   "NO MORE REFILLS WITHOUT OFFICE VISIT" 09/20/15   [name], MD  aspirin 81 MG tablet Take 81 mg by mouth daily.      [provider]  Fiber CAPS Take 3 tablets by mouth daily.     [provider]  lisinopril (PRINIVIL,ZESTRIL) 20 MG tablet TAKE ONE TABLET BY MOUTH ONCE DAILY   "NO MORE REFILLS WITHOUT OFFICE VISIT" 09/20/15   [name], MD  Multiple Vitamin (MULTIVITAMIN) tablet Take 1 tablet by mouth daily.      [provider]  nebivolol (BYSTOLIC) 10 MG tablet TAKE 1 TABLET DAILY  "OFFICE VISIT NEEDED FOR REFILLS" 10/02/15   [name], MD  triamcinolone cream (KENALOG) 0.1 % Apply 1 application topically 2 (two) times daily. 03/14/15   Garlon Hatchet, PA-C      Allergies    Sulfa antibiotics    Review of Systems   Review of Systems  Physical Exam Updated Vital Signs BP (!) 161/109 (BP Location: Left Arm)   Pulse (!) 105   Temp 98.9 F (37.2 C)   Resp 19   Ht 5\' 10"  (1.778 m)   Wt 131.5 kg   SpO2 98%   BMI 41.60 kg/m  Physical Exam  ED Results / Procedures / Treatments   Labs (all labs ordered are listed, but only abnormal results are displayed) Labs Reviewed  RAPID URINE DRUG SCREEN, HOSP PERFORMED - Abnormal; Notable for the following components:      Result Value    Tetrahydrocannabinol POSITIVE (*)    All other components within normal limits  DRUG SCREEN 10 W/CONF, SERUM    EKG None  Radiology No results found.  Procedures Procedures  {Document cardiac monitor, telemetry assessment procedure when appropriate:1}  Medications Ordered in ED Medications - No data to display  ED Course/ Medical Decision Making/ A&P                           Medical Decision Making Amount and/or Complexity of Data Reviewed Labs: ordered.   ***  {Document critical care time when appropriate:1} {Document review of labs and clinical decision tools ie heart score, Chads2Vasc2 etc:1}  {Document your independent review of radiology images, and any outside records:1} {Document your discussion with family members, caretakers, and with consultants:1} {Document social determinants of health affecting pt's care:1} {Document your decision making why or why not admission, treatments were needed:1} Final Clinical Impression(s) / ED Diagnoses Final diagnoses:  Worried well    Rx / DC Orders ED Discharge Orders     None

## 2022-05-12 NOTE — ED Triage Notes (Signed)
Patient here POV from Home.  Endorses being at a [employer]. States he is tested Weekly for Substance Intake and has been Positive for THC every time despite No Intake of THC. Requesting Blood Panel to Test for THC.   No Other Complaints.   NAD Noted during Triage. A&Ox4. GCS 15. Ambulatory.

## 2022-05-12 NOTE — ED Notes (Signed)
Pt verbalizes understanding of discharge instructions. Opportunity for questioning and answers were provided. Pt discharged from ED to home with friend. Denies discharge vital signs.

## 2022-05-12 NOTE — Discharge Instructions (Signed)
The blood drug test is pending.  You can check the results on MyChart

## 2022-05-12 NOTE — ED Notes (Signed)
Pt A&Ox4 at time of triage. Complaint of 4 positive UDS for THC. Denies use of THC for over four years.

## 2022-05-14 DIAGNOSIS — M109 Gout, unspecified: Secondary | ICD-10-CM | POA: Diagnosis not present

## 2022-05-14 DIAGNOSIS — E782 Mixed hyperlipidemia: Secondary | ICD-10-CM | POA: Diagnosis not present

## 2022-05-14 DIAGNOSIS — I10 Essential (primary) hypertension: Secondary | ICD-10-CM | POA: Diagnosis not present

## 2022-05-22 LAB — THC,MS,WB/SP RFX
Cannabidiol: NEGATIVE ng/mL
Hydroxy-THC: NEGATIVE ng/mL
Tetrahydrocannabinol(THC): NEGATIVE ng/mL

## 2022-05-22 LAB — DRUG SCREEN 10 W/CONF, SERUM
Amphetamines, IA: NEGATIVE ng/mL
Barbiturates, IA: NEGATIVE ug/mL
Benzodiazepines, IA: NEGATIVE ng/mL
Cocaine & Metabolite, IA: NEGATIVE ng/mL
Methadone, IA: NEGATIVE ng/mL
Opiates, IA: NEGATIVE ng/mL
Oxycodones, IA: NEGATIVE ng/mL
Phencyclidine, IA: NEGATIVE ng/mL
Propoxyphene, IA: NEGATIVE ng/mL

## 2022-10-02 ENCOUNTER — Other Ambulatory Visit: Payer: Self-pay

## 2022-10-02 ENCOUNTER — Emergency Department (HOSPITAL_BASED_OUTPATIENT_CLINIC_OR_DEPARTMENT_OTHER)
Admission: EM | Admit: 2022-10-02 | Discharge: 2022-10-02 | Disposition: A | Discharge status: Home / Self Care | Payer: BC Managed Care – PPO | Attending: Emergency Medicine | Admitting: Emergency Medicine

## 2022-10-02 ENCOUNTER — Encounter (HOSPITAL_BASED_OUTPATIENT_CLINIC_OR_DEPARTMENT_OTHER): Payer: Self-pay | Admitting: Urology

## 2022-10-02 ENCOUNTER — Emergency Department (HOSPITAL_BASED_OUTPATIENT_CLINIC_OR_DEPARTMENT_OTHER): Payer: BC Managed Care – PPO

## 2022-10-02 DIAGNOSIS — R072 Precordial pain: Secondary | ICD-10-CM | POA: Diagnosis not present

## 2022-10-02 DIAGNOSIS — Z7982 Long term (current) use of aspirin: Secondary | ICD-10-CM | POA: Insufficient documentation

## 2022-10-02 DIAGNOSIS — I10 Essential (primary) hypertension: Secondary | ICD-10-CM | POA: Diagnosis not present

## 2022-10-02 DIAGNOSIS — R12 Heartburn: Secondary | ICD-10-CM | POA: Insufficient documentation

## 2022-10-02 DIAGNOSIS — Z79899 Other long term (current) drug therapy: Secondary | ICD-10-CM | POA: Diagnosis not present

## 2022-10-02 DIAGNOSIS — R079 Chest pain, unspecified: Secondary | ICD-10-CM | POA: Diagnosis not present

## 2022-10-02 DIAGNOSIS — R0789 Other chest pain: Secondary | ICD-10-CM | POA: Diagnosis not present

## 2022-10-02 LAB — BASIC METABOLIC PANEL
Anion gap: 9 (ref 5–15)
BUN: 15 mg/dL (ref 6–20)
CO2: 25 mmol/L (ref 22–32)
Calcium: 9.1 mg/dL (ref 8.9–10.3)
Chloride: 103 mmol/L (ref 98–111)
Creatinine, Ser: 0.77 mg/dL (ref 0.61–1.24)
GFR, Estimated: >60 mL/min (ref >60)
Glucose, Bld: 106 mg/dL — ABNORMAL HIGH (ref 70–99)
Potassium: 4.2 mmol/L (ref 3.5–5.1)
Sodium: 137 mmol/L (ref 135–145)

## 2022-10-02 LAB — HEPATIC FUNCTION PANEL
ALT: 21 U/L (ref 0–44)
AST: 23 U/L (ref 15–41)
Albumin: 4 g/dL (ref 3.5–5.0)
Alkaline Phosphatase: 115 U/L (ref 38–126)
Bilirubin, Direct: 0.2 mg/dL (ref 0.0–0.2)
Indirect Bilirubin: 0.8 mg/dL (ref 0.3–0.9)
Total Bilirubin: 1 mg/dL (ref 0.3–1.2)
Total Protein: 7.5 g/dL (ref 6.5–8.1)

## 2022-10-02 LAB — CBC
HCT: 41.2 % (ref 39.0–52.0)
Hemoglobin: 14 g/dL (ref 13.0–17.0)
MCH: 29.9 pg (ref 26.0–34.0)
MCHC: 34 g/dL (ref 30.0–36.0)
MCV: 87.8 fL (ref 80.0–100.0)
Platelets: 236 10*3/uL (ref 150–400)
RBC: 4.69 MIL/uL (ref 4.22–5.81)
RDW: 13.7 % (ref 11.5–15.5)
WBC: 7.3 10*3/uL (ref 4.0–10.5)
nRBC: 0 % (ref 0.0–0.2)

## 2022-10-02 LAB — TROPONIN I (HIGH SENSITIVITY)
Troponin I (High Sensitivity): 3 ng/L (ref <18)
Troponin I (High Sensitivity): 3 ng/L (ref <18)

## 2022-10-02 MED ORDER — PANTOPRAZOLE SODIUM 40 MG IV SOLR
40.0000 mg | Freq: Once | INTRAVENOUS | Status: AC
  Administered 2022-10-02: 40 mg via INTRAVENOUS
  Filled 2022-10-02: qty 10

## 2022-10-02 MED ORDER — PANTOPRAZOLE SODIUM 20 MG PO TBEC: 20.0000 mg | DELAYED_RELEASE_TABLET | Freq: Every day | ORAL | 0 refills | Status: DC

## 2022-10-02 NOTE — ED Provider Notes (Signed)
  Physical Exam  BP 136/70   Pulse (!) 49   Temp 98.2 F (36.8 C)   Resp 20   Ht 5\' 10"  (1.778 m)   Wt 131.5 kg   SpO2 97%   BMI 41.61 kg/m   Physical Exam  Procedures  Procedures  ED Course / MDM   Clinical Course as of 10/02/22 1606  Wed Oct 02, 2022  1429 Hepatic function panel Hepatic function normal [JK]  1429 Troponin I (High Sensitivity) normal [JK]  1430 CBC Normal [JK]  1430 Basic metabolic panel(!) Normal [JK]  1430 DG Chest 2 View Chest x-ray without acute findings [JK]    Clinical Course User Index [JK] Oct 04, 2022, MD   Medical Decision Making Care assumed at 3 pm.  Patient is here with chest pain.  Low risk for ACS.  Signed out pending second troponin and anticipate discharge home with PPIs.  4:09 PM Second troponin negative.  Patient stable for discharge.  Problems Addressed: Chest pain, unspecified type: acute illness or injury  Amount and/or Complexity of Data Reviewed Labs: ordered. Decision-making details documented in ED Course. Radiology: ordered. Decision-making details documented in ED Course.  Risk Prescription drug management.          Linwood Dibbles, MD 10/02/22 [phone]

## 2022-10-02 NOTE — Discharge Instructions (Signed)
Please take Protonix 20 mg daily.  Your heart enzyme tests are normal right now.  If you have persistent chest pain please call cardiology for follow-up for a stress test  Return to ER if you have worse chest pain, shortness of breath, abdominal pain.

## 2022-10-02 NOTE — ED Provider Notes (Signed)
[facility] EMERGENCY DEPARTMENT Provider Note   CSN: [id number] Arrival date & time: 10/02/22  1248     History {Add pertinent medical, surgical, social history, OB history to HPI:1} Chief Complaint  Patient presents with   Chest Pain    Tyrone Barnett is a 51 y.o. male.   Chest Pain Pain location:  Substernal area Pain quality: burning   Pain radiates to:  Upper back Pain severity:  Moderate Duration:  4 days Progression:  Worsening Chronicity:  New Relieved by:  Nothing Worsened by:  Nothing Ineffective treatments:  Antacids Associated symptoms: heartburn   Associated symptoms comment:  Burping, bad taste in the mouth Risk factors: hypertension and male sex   Risk factors: no coronary artery disease, no diabetes mellitus, no high cholesterol and no smoking        Home Medications Prior to Admission medications   Medication Sig Start Date End Date Taking? Authorizing Provider  allopurinol (ZYLOPRIM) 100 MG tablet TAKE ONE TABLET BY MOUTH ONCE DAILY   "NO MORE REFILLS WITHOUT OFFICE VISIT" 09/20/15   [name], MD  aspirin 81 MG tablet Take 81 mg by mouth daily.      [provider]  Fiber CAPS Take 3 tablets by mouth daily.     [provider]  lisinopril (PRINIVIL,ZESTRIL) 20 MG tablet TAKE ONE TABLET BY MOUTH ONCE DAILY   "NO MORE REFILLS WITHOUT OFFICE VISIT" 09/20/15   [name], MD  Multiple Vitamin (MULTIVITAMIN) tablet Take 1 tablet by mouth daily.      [provider]  nebivolol (BYSTOLIC) 10 MG tablet TAKE 1 TABLET DAILY  "OFFICE VISIT NEEDED FOR REFILLS" 10/02/15   [name], MD  triamcinolone cream (KENALOG) 0.1 % Apply 1 application topically 2 (two) times daily. 03/14/15   Garlon Hatchet, PA-C      Allergies    Sulfa antibiotics    Review of Systems   Review of Systems  Cardiovascular:  Positive for chest pain.  Gastrointestinal:  Positive for heartburn.    Physical Exam Updated Vital  Signs BP 123/62 (BP Location: Right Arm)   Pulse (!) 56   Temp 98.2 F (36.8 C) (Oral)   Resp 18   Ht 1.778 m (5\' 10" )   Wt 131.5 kg   SpO2 98%   BMI 41.61 kg/m  Physical Exam Vitals and nursing note reviewed.  Constitutional:      General: He is not in acute distress.    Appearance: He is well-developed.     Comments: Increased bmi  HENT:     Head: Normocephalic and atraumatic.     Right Ear: External ear normal.     Left Ear: External ear normal.  Eyes:     General: No scleral icterus.       Right eye: No discharge.        Left eye: No discharge.     Conjunctiva/sclera: Conjunctivae normal.  Neck:     Trachea: No tracheal deviation.  Cardiovascular:     Rate and Rhythm: Normal rate and regular rhythm.  Pulmonary:     Effort: Pulmonary effort is normal. No respiratory distress.     Breath sounds: Normal breath sounds. No stridor. No wheezing or rales.  Abdominal:     General: Bowel sounds are normal. There is no distension.     Palpations: Abdomen is soft.     Tenderness: There is no abdominal tenderness. There is no guarding or rebound.  Musculoskeletal:  General: No tenderness or deformity.     Cervical back: Neck supple.  Skin:    General: Skin is warm and dry.     Findings: No rash.  Neurological:     General: No focal deficit present.     Mental Status: He is alert.     Cranial Nerves: No cranial nerve deficit (no facial droop, extraocular movements intact, no slurred speech).     Sensory: No sensory deficit.     Motor: No abnormal muscle tone or seizure activity.     Coordination: Coordination normal.  Psychiatric:        Mood and Affect: Mood normal.     ED Results / Procedures / Treatments   Labs (all labs ordered are listed, but only abnormal results are displayed) Labs Reviewed  CBC  BASIC METABOLIC PANEL  TROPONIN I (HIGH SENSITIVITY)    EKG EKG Interpretation  Date/Time:  Wednesday October 02 2022 12:55:10 EST Ventricular Rate:   55 PR Interval:  197 QRS Duration: 88 QT Interval:  429 QTC Calculation: 411 R Axis:   38 Text Interpretation: Sinus rhythm No old tracing to compare Reconfirmed by Linwood Dibbles [phone]) on 10/02/2022 12:57:45 PM  Radiology No results found.  Procedures Procedures  {Document cardiac monitor, telemetry assessment procedure when appropriate:1}  Medications Ordered in ED Medications - No data to display  ED Course/ Medical Decision Making/ A&P                           Medical Decision Making Amount and/or Complexity of Data Reviewed Labs: ordered. Radiology: ordered.  Risk Prescription drug management.   ***  {Document critical care time when appropriate:1} {Document review of labs and clinical decision tools ie heart score, Chads2Vasc2 etc:1}  {Document your independent review of radiology images, and any outside records:1} {Document your discussion with family members, caretakers, and with consultants:1} {Document social determinants of health affecting pt's care:1} {Document your decision making why or why not admission, treatments were needed:1} Final Clinical Impression(s) / ED Diagnoses Final diagnoses:  None    Rx / DC Orders ED Discharge Orders     None

## 2022-10-02 NOTE — ED Triage Notes (Signed)
Pt states mid chest pain x 4 days  Denies radiation of pain or associated symptoms  Denies SOB, Denies N/V States feels like indigestion  States take OTC meds for gerd but its not working

## 2022-11-07 ENCOUNTER — Ambulatory Visit: Payer: BC Managed Care – PPO | Admitting: Cardiology

## 2022-12-13 ENCOUNTER — Ambulatory Visit: Payer: BC Managed Care – PPO | Attending: Cardiology | Admitting: Cardiology

## 2022-12-13 ENCOUNTER — Other Ambulatory Visit (HOSPITAL_COMMUNITY)
Admission: EM | Admit: 2022-12-13 | Discharge: 2022-12-17 | Disposition: A | Discharge status: Rehabilitation Facility | Payer: BC Managed Care – PPO | Attending: Psychiatry | Admitting: Psychiatry

## 2022-12-13 DIAGNOSIS — F1414 Cocaine abuse with cocaine-induced mood disorder: Secondary | ICD-10-CM | POA: Insufficient documentation

## 2022-12-13 DIAGNOSIS — F329 Major depressive disorder, single episode, unspecified: Secondary | ICD-10-CM | POA: Insufficient documentation

## 2022-12-13 DIAGNOSIS — F1994 Other psychoactive substance use, unspecified with psychoactive substance-induced mood disorder: Secondary | ICD-10-CM | POA: Diagnosis not present

## 2022-12-13 DIAGNOSIS — Z79899 Other long term (current) drug therapy: Secondary | ICD-10-CM | POA: Insufficient documentation

## 2022-12-13 DIAGNOSIS — Z5902 Unsheltered homelessness: Secondary | ICD-10-CM | POA: Diagnosis not present

## 2022-12-13 DIAGNOSIS — Z1152 Encounter for screening for COVID-19: Secondary | ICD-10-CM | POA: Diagnosis not present

## 2022-12-13 DIAGNOSIS — Z91148 Patient's other noncompliance with medication regimen for other reason: Secondary | ICD-10-CM | POA: Insufficient documentation

## 2022-12-13 DIAGNOSIS — I10 Essential (primary) hypertension: Secondary | ICD-10-CM

## 2022-12-13 DIAGNOSIS — F909 Attention-deficit hyperactivity disorder, unspecified type: Secondary | ICD-10-CM | POA: Insufficient documentation

## 2022-12-13 DIAGNOSIS — F4310 Post-traumatic stress disorder, unspecified: Secondary | ICD-10-CM | POA: Diagnosis not present

## 2022-12-13 DIAGNOSIS — F1420 Cocaine dependence, uncomplicated: Secondary | ICD-10-CM

## 2022-12-13 DIAGNOSIS — F331 Major depressive disorder, recurrent, moderate: Secondary | ICD-10-CM

## 2022-12-13 LAB — POC SARS CORONAVIRUS 2 AG: SARSCOV2ONAVIRUS 2 AG: NEGATIVE

## 2022-12-13 LAB — RESP PANEL BY RT-PCR (RSV, FLU A&B, COVID)  RVPGX2
Influenza A by PCR: NEGATIVE
Influenza B by PCR: NEGATIVE
Resp Syncytial Virus by PCR: NEGATIVE
SARS Coronavirus 2 by RT PCR: NEGATIVE

## 2022-12-13 MED ORDER — HYDROXYZINE HCL 25 MG PO TABS
25.0000 mg | ORAL_TABLET | Freq: Three times a day (TID) | ORAL | Status: DC | PRN
  Administered 2022-12-14 – 2022-12-16 (×3): 25 mg via ORAL
  Filled 2022-12-13 (×4): qty 1

## 2022-12-13 MED ORDER — MAGNESIUM HYDROXIDE 400 MG/5ML PO SUSP: 30.0000 mL | Freq: Every day | ORAL | Status: DC | PRN

## 2022-12-13 MED ORDER — ALUM & MAG HYDROXIDE-SIMETH 200-200-20 MG/5ML PO SUSP: 30.0000 mL | ORAL | Status: DC | PRN

## 2022-12-13 MED ORDER — ACETAMINOPHEN 325 MG PO TABS: 650.0000 mg | ORAL_TABLET | Freq: Four times a day (QID) | ORAL | Status: DC | PRN

## 2022-12-13 MED ORDER — TRAZODONE HCL 50 MG PO TABS
50.0000 mg | ORAL_TABLET | Freq: Every evening | ORAL | Status: DC | PRN
  Filled 2022-12-13 (×2): qty 1

## 2022-12-13 NOTE — ED Notes (Signed)
Pt asleep in bed. Respirations even and unlabored. Monitoring for safety. 

## 2022-12-13 NOTE — BH Assessment (Signed)
Comprehensive Clinical Assessment (CCA) Note  12/13/2022 Tyrone Barnett [id number]  Chief Complaint:  Chief Complaint  Patient presents with   Addiction Problem    Relapse on crack cocaine after 9 months sobriety.    Depression    Has prozac rx. Denies hx of suicide attempts. Passive SI without plan.   Anxiety    Reports obsessive worries   Post-Traumatic Stress Disorder    In [place] for 9/11   Visit Diagnosis: Substance Induced Mood Disorder Disposition: Patient was admitted to [facility]   CCA Screening, Triage and Referral (STR)  Patient Reported Information How did you hear about [place]? Family/Friend  What Is the Reason for Your Visit/Call Today? Tearful, upset that he relapsed on crack cocaine after 9 months of sobriety, (and 15 years before that). Depressed mood, increased anxiety and stopped attending NA leading up to relapse.  How Long Has This Been Causing You Problems? 1-6 months  What Do You Feel Would Help You the Most Today? Treatment for Depression or other mood problem; Alcohol or Drug Use Treatment   Have You Recently Had Any Thoughts About Hurting Yourself? Yes  Are You Planning to Commit Suicide/Harm Yourself At This time? No   Flowsheet Row ED from 12/13/2022 in [facility] ED from 10/02/2022 in [facility] Emergency Department at [facility] ED from 05/12/2022 in [facility] Emergency Department at [place] No Risk No Risk       Have you Recently Had Thoughts About [place]? No  Are You Planning to Harm Someone at This Time? No  Explanation: No data recorded  Have You Used Any Alcohol or Drugs in the Past 24 Hours? Yes  What Did You Use and How Much? unknown amount of crack cocaine- smoke and snort. Pt states he was told he also used methamphetamine but does not remember it.   Do You Currently Have a Therapist/Psychiatrist? No data recorded Name of Therapist/Psychiatrist:     Have You Been Recently Discharged From Any Office Practice or Programs? No data recorded Explanation of Discharge From Practice/Program: No data recorded    CCA Screening Triage Referral Assessment Type of Contact: No data recorded Telemedicine Service Delivery:   Is this Initial or Reassessment?   Date Telepsych consult ordered in [facility]:    Time Telepsych consult ordered in [facility]:    Location of Assessment: No data recorded Provider Location: No data recorded  Collateral Involvement: No data recorded  Does Patient Have a [place]? No data recorded Legal Guardian Contact Information: No data recorded Copy of Legal Guardianship Form: No data recorded Legal Guardian Notified of Arrival: No data recorded Legal Guardian Notified of Pending Discharge: No data recorded If Minor and Not Living with Parent(s), Who has Custody? No data recorded Is CPS involved or ever been involved? No data recorded Is APS involved or ever been involved? No data recorded  Patient Determined To Be At Risk for Harm To Self or Others Based on Review of Patient Reported Information or Presenting Complaint? No data recorded Method: No data recorded Availability of Means: No data recorded Intent: No data recorded Notification Required: No data recorded Additional Information for Danger to Others Potential: No data recorded Additional Comments for Danger to Others Potential: No data recorded Are There Guns or Other Weapons in Your Home? No data recorded Types of Guns/Weapons: No data recorded Are These Weapons Safely Secured?  No data recorded Who Could Verify You Are Able To Have These Secured: No data recorded Do You Have any Outstanding Charges, Pending Court Dates, Parole/Probation? No data recorded Contacted To Inform of Risk of Harm To Self or Others: No data recorded   Does Patient Present under Involuntary Commitment? No data recorded   [place] of  Residence: No data recorded  Patient Currently Receiving the Following Services: No data recorded  Determination of Need: Urgent (48 hours)   Options For Referral: Medication Management; Chemical Dependency Intensive Outpatient Therapy (CDIOP); Facility-Based Crisis; Intensive Outpatient Therapy; Outpatient Therapy     CCA Biopsychosocial Patient Reported Schizophrenia/Schizoaffective Diagnosis in Past: No   Strengths: Significant past sobriety; wife and children as motivation   Mental Health Symptoms Depression:   Change in energy/activity; Difficulty Concentrating; Fatigue; Hopelessness; Increase/decrease in appetite; Irritability; Tearfulness; Worthlessness   Duration of Depressive symptoms:  Duration of Depressive Symptoms: Greater than two weeks   Mania:   N/A   Anxiety:    Difficulty concentrating; Irritability; Restlessness; Sleep; Tension; Worrying; Fatigue   Psychosis:   None   Duration of Psychotic symptoms:    Trauma:   Avoids reminders of event; Detachment from others; Emotional numbing; Guilt/shame; Hypervigilance; Irritability/anger   Obsessions:   Absent   Compulsions:   N/A   Inattention:   N/A   Hyperactivity/Impulsivity:   N/A   Oppositional/Defiant Behaviors:   N/A   Emotional Irregularity:   Intense/unstable relationships   Other Mood/Personality Symptoms:  No data recorded   Mental Status Exam Appearance and self-care  Stature:   Average   Weight:   Overweight   Clothing:   Casual; Disheveled   Grooming:   Neglected   Cosmetic use:   None   Posture/gait:   Tense   Motor activity:   Restless   Sensorium  Attention:   Normal   Concentration:   Normal   Orientation:   X5   Recall/memory:   Normal   Affect and Mood  Affect:   Anxious; Depressed   Mood:   Anxious; Depressed   Relating  Eye contact:   Normal   Facial expression:   Depressed; Responsive; Tense   Attitude toward examiner:    Cooperative   Thought and Language  Speech flow:  Clear and Coherent   Thought content:   Appropriate to Mood and Circumstances   Preoccupation:   None   Hallucinations:   None   Organization:   Coherent   [employer] of Knowledge:   Good   Intelligence:   Average   Abstraction:   Normal   Judgement:   Fair   Art therapist:   Realistic   Insight:   Gaps   Decision Making:   Vacilates   Social Functioning  Social Maturity:   Irresponsible; Responsible   Social Judgement:   Normal   Stress  Stressors:   Family conflict; Relationship   Coping Ability:   Exhausted; Deficient supports   Skill Deficits:   Interpersonal; Responsibility; Self-care   Supports:   Family; Friends/Service system; Support needed; Other (Comment) (12 step programs)     Religion: Religion/Spirituality Are You A Religious Person?: No  Leisure/Recreation: Leisure / Recreation Do You Have Hobbies?: Yes 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)  Exercise/Diet: Exercise/Diet Do You Exercise?: No Have You Gained or Lost A Significant Amount of Weight in the Past Six Months?: No Do You Follow a Special Diet?:  No Do You Have Any Trouble Sleeping?: Yes Explanation of Sleeping Difficulties: decreased sleep   CCA Employment/Education Employment/Work Situation: Employment / Work Situation Employment Situation: Employed Work Stressors: Comptroller has Been Impacted by Current Illness: Yes Describe how Patient's Job has Been Impacted: access to drugs through work Has Patient ever Been in Passenger transport manager?: No  Education: Education Is Patient Currently Attending School?: No Did Physicist, medical?: Yes What Type of College Degree Do you Have?: Masters in [employer] Did You Have An Individualized Education Program (IIEP): No Did You Have Any Difficulty At [employer]?: No Patient's Education Has Been Impacted  by Current Illness: No   CCA Family/Childhood History Family and Relationship History: Family history Marital status: Married What types of issues is patient dealing with in the relationship?: Pt states wife, [name], does not want him living at home since relapse 5 days ago Does patient have children?: Yes How is patient's relationship with their children?: good  Childhood History:  Childhood History By whom was/is the patient raised?: Both parents Did patient suffer any verbal/emotional/physical/sexual abuse as a child?: No (n/a) Did patient suffer from severe childhood neglect?: No (n/a) Has patient ever been sexually abused/assaulted/raped as an adolescent or adult?: No (n/a) Was the patient ever a victim of a crime or a disaster?: Yes Patient description of being a victim of a crime or disaster: Pt was in [place] for 07/15/97 Witnessed domestic violence?:  (n/a) Has patient been affected by domestic violence as an adult?:  (n/a)       CCA Substance Use Alcohol/Drug Use: Alcohol / Drug Use Pain Medications: denies Prescriptions: Prozac, Gabapentin & BP meds- none taken in past 5 days Over the Counter: n/a History of alcohol / drug use?: Yes Longest period of sobriety (when/how long): 15 years Negative Consequences of Use: Financial, Personal relationships, Work / Youth worker Withdrawal Symptoms: Patient aware of relationship between substance abuse and physical/medical complications Substance #1 Name of Substance 1: cocaine 1 - Age of First Use: 22 1 - Amount (size/oz): varies 1 - Frequency: x the past 5 days; 9 months prior he was clean and sober 1 - Duration: x 5 days 1 - Last Use / Amount: unknown binge amount used over past 5 days 1 - Method of Aquiring: from customer 1- Route of Use: smoke and snort Substance #2 Name of Substance 2: Methamphetamine 2 - Age of First Use: 51 2 - Amount (size/oz): unknown 2 - Frequency: over past 5 days 2 - Duration: unknown by pt- he  states he doesn't remember using but was told he used methamphetamines during cocaine binge of past week 2 - Last Use / Amount: Unknown- he states he doesn't remember using but was told he used methamphetamines 2 - Method of Aquiring: unknown by patient 2 - Route of Substance Use: unknown by patient                     ASAM's:  Six Dimensions of Multidimensional Assessment  Dimension 1:  Acute Intoxication and/or Withdrawal Potential:      Dimension 2:  Biomedical Conditions and Complications:      Dimension 3:  Emotional, Behavioral, or Cognitive Conditions and Complications:     Dimension 4:  Readiness to Change:     Dimension 5:  Relapse, Continued use, or Continued Problem Potential:     Dimension 6:  Recovery/Living Environment:     ASAM Severity Score:    ASAM Recommended Level of Treatment:  Substance use Disorder (SUD)    Recommendations for Services/Supports/Treatments:    Discharge Disposition: Discharge Disposition Medical Exam completed: Yes Disposition of Patient: Admit (Admit to [facility]) Mode of transportation if patient is discharged/movement?: Car  DSM5 Diagnoses: Patient Active Problem List   Diagnosis Date Noted   Substance induced mood disorder ([place]) 12/13/2022   Nephrolithiasis 02/15/2022   Severe cocaine use disorder ([place]) 01/28/2021   Cocaine abuse with cocaine-induced mood disorder ([place]) 01/28/2021   Hypertension 05/30/2011   Obesity 05/30/2011   Supraventricular tachycardia 05/30/2011     Referrals to Alternative Service(s): Referred to Alternative Service(s):   Place:   Date:   Time:    Referred to Alternative Service(s):   Place:   Date:   Time:    Referred to Alternative Service(s):   Place:   Date:   Time:    Referred to Alternative Service(s):   Place:   Date:   Time:     Nasire Reali Tora Perches, LCSW

## 2022-12-13 NOTE — ED Notes (Signed)
STAT lab courier called to transport labs to [facility] lab

## 2022-12-13 NOTE — ED Notes (Signed)
Patient  states that he can not void at this time

## 2022-12-13 NOTE — ED Notes (Signed)
Patient admitted to [facility] from [facility] for detox from crack cocaine.  Patient accepted onto the unit and oriented to layout.  He was shown to his room and asked to stay in his room for 2 hours until PCR comes back.  91mnute covid test negative.  Patient needs blood work he had not eaten in 5 days and patient was dehydrated.  Patient also needs to given urine sample.  Patient has cup next to his bed.  He was given a meal and juice.  Encouraged to increase PO fluids.  Patient calm and pleasant on approach.  Denies avh shi or plan.  Skin unremarkable.  He is resting in his room at this time.  Patient had long history of sobriety and appears motivated for treatment.  Will monitor and provide a safe environment.

## 2022-12-13 NOTE — ED Notes (Signed)
Called report to [facility] nurse

## 2022-12-13 NOTE — BH Assessment (Addendum)
The patient demonstrates the following risk factors for suicide: Chronic risk factors for suicide include: substance use disorder. Acute risk factors for suicide include: family or marital conflict and loss (financial, interpersonal, professional). Protective factors for this patient include: responsibility to others (children, family) and coping skills. Considering these factors, the overall suicide risk at this point appears to be low. Patient is appropriate for outpatient follow up.   [place] ED from 12/13/2022 in [facility] ED from 10/02/2022 in [facility] Emergency Department at [facility] ED from 05/12/2022 in [facility] Emergency Department at [place] No Risk No Risk         Tyrone Barnett is a 52 yo married/separated male who presents voluntarily to [facility] for assessment. Pt presents tearful and upset with himself after relapse on cocaine (smoking and snorting) after 9 months of sobriety (and 15 years before that). Fallon reports depressed mood and increased anxiety. He reports passive SI without intent or plan. He denies HI and AVH. He denies past suicide attempts. Andreu reports he stopped going to NA meetings leading up to relapse.  Pt is married with children. After this relapse on cocaine, his wife said he could not return to their home. Patient gave verbal authorization to speak with his wife, [name], [phone]. Other supports include friends and NA contacts. Pt reports his father was an alcoholic. Pt has fair insight and judgement. Memory is intact and he denies hx of significant legal charges.  Pt states he is well-versed in substance abuse tx with his own journey/ multiple relapses, and work as a substance abuse Social worker. 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   Protective factors against suicide include friend & NA support, responsibility to his children, no current SI, no past suicide attempts, owns no firearms, and no sx of psychosis.  Pt denies current outpt counseling and psychiatric medication management. He had previous substance abuse tx at [facility].  MSE: Pt is casually dress/somewhat disheveled, alert, oriented x 4 with normal speech and normal motor behavior. Eye contact is good. Pt's mood is depressed and anxious and affect is congruent. Thought process is coherent and relevant. There is no indication Pt is currently responding to interal stimuli or experiencing delusional thought content. Pt was cooperative throughout assessment.

## 2022-12-13 NOTE — ED Provider Notes (Signed)
Facility Based Crisis Admission H&P  Date: 12/13/22 Patient Name: Tyrone Barnett MRN: [id number] Chief Complaint: "I relapsed on coc after 9 years..."  Diagnoses:  Final diagnoses:  Substance induced mood disorder (HCC)    HPI:  52 year-old male who presents voluntarily, complaining about his problem with cocaine use. Patient reports that he relapsed on cocaine 5 days ago after 9 years of sobriety. He has also been 51 years sober before.   He reports that his wife (who is a Software engineer) kicked him out of the house secondary to his drug use.  He reports that he has been up since Monday "because I couldn't sleep".  Patient reports that he has been sleeping in his car, unsure where to go and what to do. His wife encouraged him to get some help with his substance use problem. He reports that he uses all his income on cocaine "I get easy money from [place], and use it all". Patient reports that "I really have been struggling for the past 2 years but now, my wife  can't take it anymore". Patient reports that cocaine is basically  the only substance he currently uses. He used to drink alcohol but has stopped years ago. He reports that he has received long term treatments at many facilities in the past. The most recent were at [facility] in [place]. He has received more services out of [facility]. 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 He reports a  family history of substance use : his dad was alcoholic and  his cousins use different substances. He reports that things got worse when his father died 5 years ago and patient had to take over all responsibilities including taking care of his mother. He reports that he currently does not get along with his brother "because I get on his nerves".  Patient reports that he has been diagnosed with MDD and PTSD and his current medication regimen include Prozac. He has not been taking his medications for 5 days due to  homelessness. He also reports that he was diagnosed with ADHD in his childhood, took Adderall but stopped taking it at age 52 "I was doing very well...".  His medical history includes HTN and gout and he takes prescribed medications. Patient is married and has three  young children and wife does not want him around them while  he is still using drugs.   Assessment: Patient seen face-to-face by this provider. Chart and current vital signs reviewed.   52 year-old male who appears obese. He is restless and hopeless. He is receptive and cooperative upon approach. He is fully alert and oriented. He is somewhat disheveled, anxious. He does not appear to be preoccupied. Denies SI/HI/AVH. Patient  has full awareness and  is knowledgeable of his problem. His thought process is goal-oriented. He reports not being able to sleep due to over thinking and unfavorable conditions. He is expressing remorse related to his poor decisions and verbalizes readiness for self-improvement. He rates his depression and anxiety at 10/10. He denies suicidal thoughts and states "I know I am going through a lot but I have never done anything to harm myself". He denies HI and hallucinations. He reports using cocaine almost every day and the amount varies.  He reports that treatment is the only way he can go back home and be around his wife and children. His current medications include Prozac,  and other medications including  Aspirin, Allopurinol, Lisinopril and others. Patient reports that he was recently treated at ED secondary to chest pain, was stabilized and discharged home.    I discussed with patient about  admission to [facility] to address his substance use problem and patient  is willing to go for treatment. No current medical emergency noted.         PHQ 2-9:  [place] ED from 12/13/2022 in [facility]  Thoughts that you would be better off dead, or of hurting yourself in some way Not at all   PHQ-9 Total Score 7       [place] ED from 10/02/2022 in [facility] Emergency Department at [facility] ED from 05/12/2022 in [facility] Emergency Department at [place] No Risk No Risk        Total Time spent with patient: 51 minutes  Musculoskeletal  Strength & Muscle Tone: within normal limits Gait & Station: normal Patient leans: Right  Psychiatric Specialty Exam  Presentation General Appearance:  Disheveled  Eye Contact: Fair  Speech: Clear and Coherent  Speech Volume: Normal  Handedness: Right   Mood and Affect  Mood: Anxious; Depressed (tearful)  Affect: Depressed; Tearful   Thought Process  Thought Processes: Coherent; Goal Directed  Descriptions of Associations:No data recorded Orientation:Full (Time, Place and Person)  Thought Content:Logical  Diagnosis of Schizophrenia or Schizoaffective disorder in past: No   Hallucinations:Hallucinations: None  Ideas of Reference:None  Suicidal Thoughts:Suicidal Thoughts: No  Homicidal Thoughts:Homicidal Thoughts: No   Sensorium  Memory: Immediate Good; Recent Good; Remote Good  Judgment: Fair  Insight: Fair   Materials engineer: Fair  Attention Span: Fair  Recall: [place] of Knowledge: Fair  Language: Good   Psychomotor Activity  Psychomotor Activity: Psychomotor Activity: Restlessness   Assets  Assets: Communication Skills; Desire for Improvement; Financial Resources/Insurance; Social Support; Transportation   Sleep  Sleep: Sleep: Poor Number of Hours of Sleep: 3 (sleeps in car)   Nutritional Assessment (For OBS and FBC admissions only) Has the patient had a weight loss or gain of 10 pounds or more in the last 3 months?: No Has the patient had a decrease in food intake/or appetite?: No Does the patient have dental problems?: No Does the patient have eating habits or behaviors that may be  indicators of an eating disorder including binging or inducing vomiting?: No Has the patient recently lost weight without trying?: 0 Has the patient been eating poorly because of a decreased appetite?: 0 Malnutrition Screening Tool Score: 0    Physical Exam Vitals and nursing note reviewed.  Constitutional:      Appearance: Normal appearance.  HENT:     Head: Normocephalic and atraumatic.     Right Ear: Tympanic membrane normal.     Left Ear: Tympanic membrane normal.     Nose: Nose normal.  Eyes:     Extraocular Movements: Extraocular movements intact.     Pupils: Pupils are equal, round, and reactive to light.  Cardiovascular:     Rate and Rhythm: Normal rate and regular rhythm.     Pulses: Normal pulses.     Heart sounds: Normal heart sounds.  Pulmonary:     Effort: Pulmonary effort is normal.     Breath sounds: Normal breath sounds.  Musculoskeletal:        General: Normal range of motion.     Cervical back: Normal range of motion.  Skin:    General:  Skin is warm and dry.  Neurological:     Mental Status: He is alert and oriented to person, place, and time.    Review of Systems  Constitutional: Negative.   HENT: Negative.    Eyes: Negative.   Respiratory: Negative.    Cardiovascular:        HTN  Gastrointestinal: Negative.   Genitourinary: Negative.   Musculoskeletal: Negative.   Skin: Negative.   Neurological: Negative.   Psychiatric/Behavioral:  Positive for depression and substance abuse. The patient is nervous/anxious and has insomnia.     Blood pressure (!) 147/87, pulse 80, temperature 98.6 F (37 C), temperature source Oral, resp. rate 18, SpO2 98 %. There is no height or weight on file to calculate BMI.  Past Psychiatric History: Patient reports a history of MDD and substance use   Is the patient at risk to self? No  Has the patient been a risk to self in the past 6 months? No .    Has the patient been a risk to self within the distant past? No    Is the patient a risk to others? No   Has the patient been a risk to others in the past 6 months? No   Has the patient been a risk to others within the distant past? No   Past Medical History: HTN, Gout Family History: Father-Alcoholism, cousins -drug use Social History: Patient is married, has 3 children. 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 Family is "somewhat supportive but disappointed in me"  Last Labs:  Admission on 10/02/2022, Discharged on 10/02/2022  Component Date Value Ref Range Status   Sodium 10/02/2022 137  135 - 145 mmol/L Final   Potassium 10/02/2022 4.2  3.5 - 5.1 mmol/L Final   Chloride 10/02/2022 103  98 - 111 mmol/L Final   CO2 10/02/2022 25  22 - 32 mmol/L Final   Glucose, Bld 10/02/2022 106 (H)  70 - 99 mg/dL Final   Glucose reference range applies only to samples taken after fasting for at least 8 hours.   BUN 10/02/2022 15  6 - 20 mg/dL Final   Creatinine, Ser 10/02/2022 0.77  0.61 - 1.24 mg/dL Final   Calcium 10/02/2022 9.1  8.9 - 10.3 mg/dL Final   GFR, Estimated 10/02/2022 >60  >60 mL/min Final   Comment: (NOTE) Calculated using the CKD-EPI Creatinine Equation (2021)    Anion gap 10/02/2022 9  5 - 15 Final   Performed at [facility], [address] [place], [place] [address]   WBC 10/02/2022 7.3  4.0 - 10.5 K/uL Final   RBC 10/02/2022 4.69  4.22 - 5.81 MIL/uL Final   Hemoglobin 10/02/2022 14.0  13.0 - 17.0 g/dL Final   HCT 10/02/2022 41.2  39.0 - 52.0 % Final   MCV 10/02/2022 87.8  80.0 - 100.0 fL Final   MCH 10/02/2022 29.9  26.0 - 34.0 pg Final   MCHC 10/02/2022 34.0  30.0 - 36.0 g/dL Final   RDW 10/02/2022 13.7  11.5 - 15.5 % Final   Platelets 10/02/2022 236  150 - 400 K/uL Final   nRBC 10/02/2022 0.0  0.0 - 0.2 % Final   Performed at [facility], [address] [place], [place] [address]   Troponin I (High Sensitivity) 10/02/2022 3  <18 ng/L Final   Comment: (NOTE) Elevated high sensitivity troponin I (hsTnI) values and significant  changes  across serial measurements may suggest ACS but many other  chronic and acute conditions are known  to elevate hsTnI results.  Refer to the "Links" section for chest pain algorithms and additional  guidance. Performed at [facility], [address] [place], [place] [address]    Total Protein 10/02/2022 7.5  6.5 - 8.1 g/dL Final   Albumin 10/02/2022 4.0  3.5 - 5.0 g/dL Final   AST 10/02/2022 23  15 - 41 U/L Final   ALT 10/02/2022 21  0 - 44 U/L Final   Alkaline Phosphatase 10/02/2022 115  38 - 126 U/L Final   Total Bilirubin 10/02/2022 1.0  0.3 - 1.2 mg/dL Final   Bilirubin, Direct 10/02/2022 0.2  0.0 - 0.2 mg/dL Final   Indirect Bilirubin 10/02/2022 0.8  0.3 - 0.9 mg/dL Final   Performed at [facility], [address] [place], [place] [address]   Troponin I (High Sensitivity) 10/02/2022 3  <18 ng/L Final   Comment: (NOTE) Elevated high sensitivity troponin I (hsTnI) values and significant  changes across serial measurements may suggest ACS but many other  chronic and acute conditions are known to elevate hsTnI results.  Refer to the "Links" section for chest pain algorithms and additional  guidance. Performed at [facility], [address] [place], [place] [address]     Allergies: Coconut fatty acids and Sulfa antibiotics  Medications:  Facility Ordered Medications  Medication   acetaminophen (TYLENOL) tablet 650 mg   alum & mag hydroxide-simeth (MAALOX/MYLANTA) 200-200-20 MG/5ML suspension 30 mL   magnesium hydroxide (MILK OF MAGNESIA) suspension 30 mL   traZODone (DESYREL) tablet 50 mg   hydrOXYzine (ATARAX) tablet 25 mg   PTA Medications  Medication Sig   FLUoxetine (PROZAC) 40 MG capsule Take 40 mg by mouth daily.   amLODipine-benazepril (LOTREL) 10-40 MG capsule Take 1 capsule by mouth daily.   atenolol (TENORMIN) 100 MG tablet Take 100 mg by mouth daily.   Multiple Vitamin (MULTIVITAMIN) tablet Take 1 tablet by mouth daily.      aspirin 81 MG tablet Take 81 mg by mouth daily.     Fiber CAPS Take 3 tablets by mouth daily.    allopurinol (ZYLOPRIM) 100 MG tablet TAKE ONE TABLET BY MOUTH ONCE DAILY   "NO MORE REFILLS WITHOUT OFFICE VISIT"   pantoprazole (PROTONIX) 20 MG tablet Take 1 tablet (20 mg total) by mouth daily.    Long Term Goals: Improvement in symptoms so as ready for discharge and seeking a long-term Substance use treatment for sobriety  Short Term Goals: Patient will verbalize feelings in meetings with treatment team members., Patient will attend at least of 50% of the groups daily., Pt will complete the PHQ9 on admission, day 3 and discharge., Patient will participate in completing the [place], Patient will score a low risk of violence for 24 hours prior to discharge, and Patient will take medications as prescribed daily.  Medical Decision Making  Admit to [facility] for treatment and stabilization Initiate safety protocol  Labs ordered: CMP, CBC, UA, BAL, COVID-POC/PCR, EKG, A1c, TSH, Lipid Panel, UDS  Medications ordered:  Tylenol 650 mg PO Q 6hr PRN for mild pain Maalox 30 ml  PO Q 4 hr PRN- Indigestion Milk of Magnesia 30 ml PO  Daily  PRN mild constipation   Clinical Course as of 12/13/22 1452  Fri Dec 13, 2022  1333 EKG 12-Lead [VB]    Clinical Course User Index [VB] Haynes Kerns, NP    Recommendations  Based on my evaluation the  patient appears to have an emergency medical condition for which I recommend the patient be transferred to the emergency department for further evaluation.  Admit to [facility] for treatment and safety monitoring.   Ronelle Nigh, NP 12/13/22  2:52 PM

## 2022-12-13 NOTE — ED Notes (Signed)
Pt resting in bed. A&O x4, calm and cooperative. Denies current SI/HI/AVH. No signs of distress noted. Monitoring for safety. 

## 2022-12-13 NOTE — ED Notes (Signed)
Rn tired to draw blood x2 was unsuccessful. Patient states that he has not eaten in 5 days will hydrate patient and try to re stick later

## 2022-12-14 DIAGNOSIS — Z79899 Other long term (current) drug therapy: Secondary | ICD-10-CM | POA: Diagnosis not present

## 2022-12-14 DIAGNOSIS — F1994 Other psychoactive substance use, unspecified with psychoactive substance-induced mood disorder: Secondary | ICD-10-CM | POA: Diagnosis not present

## 2022-12-14 DIAGNOSIS — F4310 Post-traumatic stress disorder, unspecified: Secondary | ICD-10-CM | POA: Diagnosis not present

## 2022-12-14 DIAGNOSIS — Z1152 Encounter for screening for COVID-19: Secondary | ICD-10-CM | POA: Diagnosis not present

## 2022-12-14 DIAGNOSIS — Z5902 Unsheltered homelessness: Secondary | ICD-10-CM | POA: Diagnosis not present

## 2022-12-14 DIAGNOSIS — Z91148 Patient's other noncompliance with medication regimen for other reason: Secondary | ICD-10-CM | POA: Diagnosis not present

## 2022-12-14 DIAGNOSIS — F1414 Cocaine abuse with cocaine-induced mood disorder: Secondary | ICD-10-CM | POA: Diagnosis not present

## 2022-12-14 DIAGNOSIS — F909 Attention-deficit hyperactivity disorder, unspecified type: Secondary | ICD-10-CM | POA: Diagnosis not present

## 2022-12-14 DIAGNOSIS — F329 Major depressive disorder, single episode, unspecified: Secondary | ICD-10-CM | POA: Diagnosis not present

## 2022-12-14 LAB — URINALYSIS, ROUTINE W REFLEX MICROSCOPIC
Bacteria, UA: NONE SEEN
Glucose, UA: NEGATIVE mg/dL
Hgb urine dipstick: NEGATIVE
Ketones, ur: 5 mg/dL — AB
Leukocytes,Ua: NEGATIVE
Nitrite: NEGATIVE
Protein, ur: 30 mg/dL — AB
Specific Gravity, Urine: 1.03 (ref 1.005–1.030)
pH: 5 (ref 5.0–8.0)

## 2022-12-14 LAB — POCT URINE DRUG SCREEN - MANUAL ENTRY (I-SCREEN)
POC Amphetamine UR: POSITIVE — AB
POC Buprenorphine (BUP): NOT DETECTED
POC Cocaine UR: POSITIVE — AB
POC Marijuana UR: POSITIVE — AB
POC Methadone UR: NOT DETECTED
POC Methamphetamine UR: POSITIVE — AB
POC Morphine: NOT DETECTED
POC Oxazepam (BZO): POSITIVE — AB
POC Oxycodone UR: NOT DETECTED
POC Secobarbital (BAR): NOT DETECTED

## 2022-12-14 MED ORDER — AMLODIPINE BESYLATE 2.5 MG PO TABS
2.5000 mg | ORAL_TABLET | Freq: Every day | ORAL | Status: DC
  Administered 2022-12-14 – 2022-12-17 (×4): 2.5 mg via ORAL
  Filled 2022-12-14 (×4): qty 1

## 2022-12-14 MED ORDER — PANTOPRAZOLE SODIUM 20 MG PO TBEC
20.0000 mg | DELAYED_RELEASE_TABLET | Freq: Every day | ORAL | Status: DC
  Administered 2022-12-14 – 2022-12-17 (×4): 20 mg via ORAL
  Filled 2022-12-14 (×4): qty 1

## 2022-12-14 MED ORDER — ATENOLOL 25 MG PO TABS
50.0000 mg | ORAL_TABLET | Freq: Every day | ORAL | Status: AC
  Administered 2022-12-14: 50 mg via ORAL
  Filled 2022-12-14: qty 2

## 2022-12-14 MED ORDER — BENAZEPRIL HCL 20 MG PO TABS
10.0000 mg | ORAL_TABLET | Freq: Every day | ORAL | Status: DC
  Administered 2022-12-14 – 2022-12-17 (×4): 10 mg via ORAL
  Filled 2022-12-14 (×4): qty 1

## 2022-12-14 MED ORDER — DICYCLOMINE HCL 20 MG PO TABS: 20.0000 mg | ORAL_TABLET | Freq: Four times a day (QID) | ORAL | Status: DC | PRN

## 2022-12-14 MED ORDER — FLUOXETINE HCL 20 MG PO CAPS
40.0000 mg | ORAL_CAPSULE | Freq: Every day | ORAL | Status: DC
  Administered 2022-12-16 – 2022-12-17 (×2): 40 mg via ORAL
  Filled 2022-12-14 (×2): qty 2

## 2022-12-14 MED ORDER — ALLOPURINOL 100 MG PO TABS
100.0000 mg | ORAL_TABLET | Freq: Every day | ORAL | Status: DC
  Administered 2022-12-14 – 2022-12-17 (×4): 100 mg via ORAL
  Filled 2022-12-14 (×4): qty 1

## 2022-12-14 MED ORDER — ATENOLOL 25 MG PO TABS
100.0000 mg | ORAL_TABLET | Freq: Every day | ORAL | Status: DC
  Administered 2022-12-15 – 2022-12-17 (×3): 100 mg via ORAL
  Filled 2022-12-14 (×3): qty 4

## 2022-12-14 MED ORDER — METHOCARBAMOL 500 MG PO TABS: 500.0000 mg | ORAL_TABLET | Freq: Three times a day (TID) | ORAL | Status: DC | PRN

## 2022-12-14 MED ORDER — ONDANSETRON 4 MG PO TBDP: 4.0000 mg | ORAL_TABLET | Freq: Four times a day (QID) | ORAL | Status: DC | PRN

## 2022-12-14 MED ORDER — ADULT MULTIVITAMIN W/MINERALS CH
1.0000 | ORAL_TABLET | Freq: Every day | ORAL | Status: DC
  Administered 2022-12-14 – 2022-12-17 (×4): 1 via ORAL
  Filled 2022-12-14 (×4): qty 1

## 2022-12-14 MED ORDER — NAPROXEN 500 MG PO TABS: 500.0000 mg | ORAL_TABLET | Freq: Two times a day (BID) | ORAL | Status: DC | PRN

## 2022-12-14 MED ORDER — ATENOLOL 25 MG PO TABS: 100.0000 mg | ORAL_TABLET | Freq: Every day | ORAL | Status: DC

## 2022-12-14 MED ORDER — LOPERAMIDE HCL 2 MG PO CAPS: 2.0000 mg | ORAL_CAPSULE | ORAL | Status: DC | PRN

## 2022-12-14 MED ORDER — FLUOXETINE HCL 20 MG PO CAPS
20.0000 mg | ORAL_CAPSULE | Freq: Every day | ORAL | Status: AC
  Administered 2022-12-14 – 2022-12-15 (×2): 20 mg via ORAL
  Filled 2022-12-14 (×2): qty 1

## 2022-12-14 NOTE — ED Notes (Signed)
Patient awake and alert on unit.  Ate breakfast and bloodwork drawn.  Patient is calm, cooperative and pleasant.  Patient without distress or complaint.  No evidence of withdrawal.  Patient only had a pair of shorts and a t shirt so RN went with patient and picked out several t shirts and a pair of shorts.  Will monitor and provide safe environment.

## 2022-12-14 NOTE — ED Notes (Signed)
Pt resting in bed. A&O x4, calm and cooperative. Denies current SI/HI/AVH. No signs of distress noted. Monitoring for safety. 

## 2022-12-14 NOTE — ED Provider Notes (Signed)
Behavioral Health Progress Note  Date and Time: 12/14/2022 7:03 AM Name: Tyrone Barnett MRN:  [id number]  Subjective:   Tyrone Barnett is a 52 yr old male who presented to [facility] on 2/9 requesting assistance to detox from Cocaine, he was admitted to [facility] on 2/9.  PPHx is significant for MDD, Cocaine Abuse,  PTSD, and ADHD, and has been to Residential Rehab multiple times in the past.  He reports that he does not know what to do today.  He reports that he had been sober for 15 years at 1 point and that he had recently been sober for 9 months and then "messed it all up."  He reports that he is interested in residential treatment as it has been helpful for him in the past.  He reports no withdrawal symptoms.  He reports no cravings.  Discussed with him that as needed medications would be added so that if symptoms start accepting availability reported understanding.  He reports that his home meds had not been restarted and discussed that these would be.  He reports no SI, HI, or AVH.  He reports his sleep last night was fair.  He reports his appetite is good.  He reports no other concerns at present.   Diagnosis:  Final diagnoses:  Substance induced mood disorder (HCC)    Total Time spent with patient: 20 minutes  Past Psychiatric History: MDD, Cocaine Abuse,  PTSD, and ADHD, and has been to Residential Rehab multiple times in the past. Past Medical History: HTN, Gout  Family History: Father-Alcoholism, cousins -drug use  Family Psychiatric  History: Reports no [place] Social History: Patient is married, has 3 children. 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 Family is "somewhat supportive but disappointed in me"   Additional Social History:    Pain Medications: denies Prescriptions: Prozac, Gabapentin & BP meds- none taken in past 5 days Over the Counter: n/a History of alcohol / drug use?: Yes Longest period of sobriety (when/how long): 15 years Negative Consequences of Use: Financial, Personal relationships, Work /  Youth worker Withdrawal Symptoms: Patient aware of relationship between substance abuse and physical/medical complications Name of Substance 1: cocaine 1 - Age of First Use: 22 1 - Amount (size/oz): varies 1 - Frequency: x the past 5 days; 9 months prior he was clean and sober 1 - Duration: x 5 days 1 - Last Use / Amount: unknown binge amount used over past 5 days 1 - Method of Aquiring: from customer 1- Route of Use: smoke and snort Name of Substance 2: Methamphetamine 2 - Age of First Use: 51 2 - Amount (size/oz): unknown 2 - Frequency: over past 5 days 2 - Duration: unknown by pt- he states he doesn't remember using but was told he used methamphetamines during cocaine binge of past week 2 - Last Use / Amount: Unknown- he states he doesn't remember using but was told he used methamphetamines 2 - Method of Aquiring: unknown by patient 2 - Route of Substance Use: unknown by patient                Sleep: Fair  Appetite:  Good  Current Medications:  Current Facility-Administered Medications  Medication Dose Route Frequency Provider Last Rate Last Admin   acetaminophen (TYLENOL) tablet 650 mg  650 mg Oral Q6H PRN Haynes Kerns, NP       alum & mag hydroxide-simeth (MAALOX/MYLANTA) 200-200-20 MG/5ML suspension 30 mL  30 mL Oral Q4H PRN Haynes Kerns, NP  hydrOXYzine (ATARAX) tablet 25 mg  25 mg Oral TID PRN Haynes Kerns, NP       magnesium hydroxide (MILK OF MAGNESIA) suspension 30 mL  30 mL Oral Daily PRN Maple Hudson, Veronique M, NP       traZODone (DESYREL) tablet 50 mg  50 mg Oral QHS PRN Haynes Kerns, NP       Current Outpatient Medications  Medication Sig Dispense Refill   amLODipine-benazepril (LOTREL) 10-40 MG capsule Take 1 capsule by mouth daily.     atenolol (TENORMIN) 100 MG tablet Take 100 mg by mouth daily.     FLUoxetine (PROZAC) 40 MG capsule Take 40 mg by mouth daily.     allopurinol (ZYLOPRIM) 100 MG tablet TAKE ONE TABLET BY  MOUTH ONCE DAILY   "NO MORE REFILLS WITHOUT OFFICE VISIT" 90 tablet 0   aspirin 81 MG tablet Take 81 mg by mouth daily.       Fiber CAPS Take 3 tablets by mouth daily.      Multiple Vitamin (MULTIVITAMIN) tablet Take 1 tablet by mouth daily.       pantoprazole (PROTONIX) 20 MG tablet Take 1 tablet (20 mg total) by mouth daily. 30 tablet 0    Labs  Lab Results:  Admission on 12/13/2022  Component Date Value Ref Range Status   SARS Coronavirus 2 by RT PCR 12/13/2022 NEGATIVE  NEGATIVE Final   Influenza A by PCR 12/13/2022 NEGATIVE  NEGATIVE Final   Influenza B by PCR 12/13/2022 NEGATIVE  NEGATIVE Final   Comment: (NOTE) The Xpert Xpress SARS-CoV-2/FLU/RSV plus assay is intended as an aid in the diagnosis of influenza from Nasopharyngeal swab specimens and should not be used as a sole basis for treatment. Nasal washings and aspirates are unacceptable for Xpert Xpress SARS-CoV-2/FLU/RSV testing.  Fact Sheet for Patients: EntrepreneurPulse.com.au  Fact Sheet for Healthcare Providers: IncredibleEmployment.be  This test is not yet approved or cleared by the [place] FDA and has been authorized for detection and/or diagnosis of SARS-CoV-2 by FDA under an Emergency Use Authorization (EUA). This EUA will remain in effect (meaning this test can be used) for the duration of the COVID-19 declaration under Section 564(b)(1) of the Act, 21 U.S.C. section 360bbb-3(b)(1), unless the authorization is terminated or revoked.     Resp Syncytial Virus by PCR 12/13/2022 NEGATIVE  NEGATIVE Final   Comment: (NOTE) Fact Sheet for Patients: EntrepreneurPulse.com.au  Fact Sheet for Healthcare Providers: IncredibleEmployment.be  This test is not yet approved or cleared by the [place] FDA and has been authorized for detection and/or diagnosis of SARS-CoV-2 by FDA under an Emergency Use Authorization (EUA). This EUA will  remain in effect (meaning this test can be used) for the duration of the COVID-19 declaration under Section 564(b)(1) of the Act, 21 U.S.C. section 360bbb-3(b)(1), unless the authorization is terminated or revoked.  Performed at [facility], [place] [address], [place], [place] [address]    Color, Urine 12/14/2022 AMBER (A)  YELLOW Final   BIOCHEMICALS MAY BE AFFECTED BY COLOR   APPearance 12/14/2022 HAZY (A)  CLEAR Final   Specific Gravity, Urine 12/14/2022 1.030  1.005 - 1.030 Final   pH 12/14/2022 5.0  5.0 - 8.0 Final   Glucose, UA 12/14/2022 NEGATIVE  NEGATIVE mg/dL Final   Hgb urine dipstick 12/14/2022 NEGATIVE  NEGATIVE Final   Bilirubin Urine 12/14/2022 SMALL (A)  NEGATIVE Final   Ketones, ur 12/14/2022 5 (A)  NEGATIVE mg/dL Final   Protein, ur 12/14/2022 30 (A)  NEGATIVE mg/dL Final   Nitrite 12/14/2022 NEGATIVE  NEGATIVE Final   Leukocytes,Ua 12/14/2022 NEGATIVE  NEGATIVE Final   RBC / HPF 12/14/2022 21-50  0 - 5 RBC/hpf Final   WBC, UA 12/14/2022 0-5  0 - 5 WBC/hpf Final   Bacteria, UA 12/14/2022 NONE SEEN  NONE SEEN Final   Squamous Epithelial / HPF 12/14/2022 0-5  0 - 5 /HPF Final   Mucus 12/14/2022 PRESENT   Final   Performed at [facility], [place] [address], [place], [place] [address]   SARSCOV2ONAVIRUS 2 AG 12/13/2022 NEGATIVE  NEGATIVE Final   Comment: (NOTE) SARS-CoV-2 antigen NOT DETECTED.   Negative results are presumptive.  Negative results do not preclude SARS-CoV-2 infection and should not be used as the sole basis for treatment or other patient management decisions, including infection  control decisions, particularly in the presence of clinical signs and  symptoms consistent with COVID-19, or in those who have been in contact with the virus.  Negative results must be combined with clinical observations, patient history, and epidemiological information. The expected result is Negative.  Fact Sheet for Patients:  HandmadeRecipes.com.cy  Fact Sheet for Healthcare Providers: FuneralLife.at  This test is not yet approved or cleared by the [place] FDA and  has been authorized for detection and/or diagnosis of SARS-CoV-2 by FDA under an Emergency Use Authorization (EUA).  This EUA will remain in effect (meaning this test can be used) for the duration of  the COV                          ID-19 declaration under Section 564(b)(1) of the Act, 21 U.S.C. section 360bbb-3(b)(1), unless the authorization is terminated or revoked sooner.    Admission on 10/02/2022, Discharged on 10/02/2022  Component Date Value Ref Range Status   Sodium 10/02/2022 137  135 - 145 mmol/L Final   Potassium 10/02/2022 4.2  3.5 - 5.1 mmol/L Final   Chloride 10/02/2022 103  98 - 111 mmol/L Final   CO2 10/02/2022 25  22 - 32 mmol/L Final   Glucose, Bld 10/02/2022 106 (H)  70 - 99 mg/dL Final   Glucose reference range applies only to samples taken after fasting for at least 8 hours.   BUN 10/02/2022 15  6 - 20 mg/dL Final   Creatinine, Ser 10/02/2022 0.77  0.61 - 1.24 mg/dL Final   Calcium 10/02/2022 9.1  8.9 - 10.3 mg/dL Final   GFR, Estimated 10/02/2022 >60  >60 mL/min Final   Comment: (NOTE) Calculated using the CKD-EPI Creatinine Equation (2021)    Anion gap 10/02/2022 9  5 - 15 Final   Performed at [facility], [address] [place], [place] [address]   WBC 10/02/2022 7.3  4.0 - 10.5 K/uL Final   RBC 10/02/2022 4.69  4.22 - 5.81 MIL/uL Final   Hemoglobin 10/02/2022 14.0  13.0 - 17.0 g/dL Final   HCT 10/02/2022 41.2  39.0 - 52.0 % Final   MCV 10/02/2022 87.8  80.0 - 100.0 fL Final   MCH 10/02/2022 29.9  26.0 - 34.0 pg Final   MCHC 10/02/2022 34.0  30.0 - 36.0 g/dL Final   RDW 10/02/2022 13.7  11.5 - 15.5 % Final   Platelets 10/02/2022 236  150 - 400 K/uL Final   nRBC 10/02/2022 0.0  0.0 - 0.2 % Final   Performed at [facility], [address], [place], [place] [address]  Troponin I (High Sensitivity) 10/02/2022 3  <18 ng/L Final   Comment: (NOTE) Elevated high sensitivity troponin I (hsTnI) values and significant  changes across serial measurements may suggest ACS but many other  chronic and acute conditions are known to elevate hsTnI results.  Refer to the "Links" section for chest pain algorithms and additional  guidance. Performed at [facility], [address] [place], [place] [address]    Total Protein 10/02/2022 7.5  6.5 - 8.1 g/dL Final   Albumin 10/02/2022 4.0  3.5 - 5.0 g/dL Final   AST 10/02/2022 23  15 - 41 U/L Final   ALT 10/02/2022 21  0 - 44 U/L Final   Alkaline Phosphatase 10/02/2022 115  38 - 126 U/L Final   Total Bilirubin 10/02/2022 1.0  0.3 - 1.2 mg/dL Final   Bilirubin, Direct 10/02/2022 0.2  0.0 - 0.2 mg/dL Final   Indirect Bilirubin 10/02/2022 0.8  0.3 - 0.9 mg/dL Final   Performed at [facility], [address] [place], [place] [address]   Troponin I (High Sensitivity) 10/02/2022 3  <18 ng/L Final   Comment: (NOTE) Elevated high sensitivity troponin I (hsTnI) values and significant  changes across serial measurements may suggest ACS but many other  chronic and acute conditions are known to elevate hsTnI results.  Refer to the "Links" section for chest pain algorithms and additional  guidance. Performed at [facility], [address] [place] , [place] [address]     Blood Alcohol level:  No results found for: "ETH"  Metabolic Disorder Labs: No results found for: "HGBA1C", "MPG" No results found for: "PROLACTIN" Lab Results  Component Value Date   CHOL 177 10/06/2014   TRIG 303 (H) 10/06/2014   HDL 36 (L) 10/06/2014   CHOLHDL 4.9 10/06/2014   VLDL 61 (H) 10/06/2014   LDLCALC 80 10/06/2014    Therapeutic Lab Levels: No results found for: "LITHIUM" No results found for: "VALPROATE" No results found for: "CBMZ"  Physical Findings   PHQ2-9     [place] [facility] from 12/13/2022 in [facility] Office Visit from 10/06/2014 in Primary Care at [facility] Total Score 2 0  PHQ-9 Total Score 7 --      Flowsheet Row ED from 12/13/2022 in [facility] ED from 10/02/2022 in [facility] Emergency Department at [facility] ED from 05/12/2022 in [facility] Emergency Department at [place] No Risk No Risk        Musculoskeletal  Strength & Muscle Tone: within normal limits Gait & Station: normal Patient leans: N/A  Psychiatric Specialty Exam  Presentation  General Appearance:  Disheveled  Eye Contact: Fair  Speech: Clear and Coherent  Speech Volume: Normal  Handedness: Right   Mood and Affect  Mood: Anxious; Depressed (tearful)  Affect: Depressed; Tearful   Thought Process  Thought Processes: Coherent; Goal Directed  Descriptions of Associations:No data recorded Orientation:Full (Time, Place and Person)  Thought Content:Logical  Diagnosis of Schizophrenia or Schizoaffective disorder in past: No    Hallucinations:Hallucinations: None  Ideas of Reference:None  Suicidal Thoughts:Suicidal Thoughts: No  Homicidal Thoughts:Homicidal Thoughts: No   Sensorium  Memory: Immediate Good; Recent Good; Remote Good  Judgment: Fair  Insight: Fair   Materials engineer: Fair  Attention Span: Fair  Recall: [place] of Knowledge: Fair  Language: Good   Psychomotor Activity  Psychomotor Activity:  Psychomotor Activity: Restlessness   Assets  Assets: Communication Skills; Desire for Improvement; Financial Resources/Insurance; Social Support; Transportation   Sleep  Sleep: Sleep: Poor Number of Hours of Sleep: 3 (sleeps in car)   Nutritional Assessment (For OBS and FBC admissions only) Has the patient had a weight loss or gain of 10 pounds or more in the last 3  months?: No Has the patient had a decrease in food intake/or appetite?: No Does the patient have dental problems?: No Does the patient have eating habits or behaviors that may be indicators of an eating disorder including binging or inducing vomiting?: No Has the patient recently lost weight without trying?: 0 Has the patient been eating poorly because of a decreased appetite?: 0 Malnutrition Screening Tool Score: 0    Physical Exam  Physical Exam Vitals and nursing note reviewed.  Constitutional:      General: He is not in acute distress.    Appearance: Normal appearance. He is obese. He is not ill-appearing or toxic-appearing.  HENT:     Head: Normocephalic and atraumatic.  Pulmonary:     Effort: Pulmonary effort is normal.  Musculoskeletal:        General: Normal range of motion.  Neurological:     General: No focal deficit present.     Mental Status: He is alert.    Review of Systems  Respiratory:  Negative for cough and shortness of breath.   Cardiovascular:  Negative for chest pain.  Gastrointestinal:  Negative for abdominal pain, constipation, diarrhea, nausea and vomiting.  Neurological:  Negative for dizziness, weakness and headaches.  Psychiatric/Behavioral:  Positive for depression. Negative for hallucinations and suicidal ideas. The patient is nervous/anxious.    Blood pressure (!) 151/87, pulse 72, temperature 98.4 F (36.9 C), temperature source Oral, resp. rate 18, SpO2 97 %. There is no height or weight on file to calculate BMI.  Treatment Plan Summary: Daily contact with patient to assess and evaluate symptoms and progress in treatment and Medication management   Tyrone Barnett is a 51 yr old male who presented to [facility] on 2/9 requesting assistance to detox from Cocaine, he was admitted to [facility] on 2/9.  PPHx is significant for MDD, Cocaine Abuse,  PTSD, and ADHD, and has been to Residential Rehab multiple times in the past.   Tyrone Barnett has not had any withdrawal  symptoms at this time.  We will start PRN medications so that he can have them if needed.  We will restart his home medications but will start his Prozac and Atenolol at lower doses due to not having them in 6 days with the plan of getting back to home dose.  He is interested in going to residential treatment so will request assistance from social work on Monday.  We will continue to monitor.   Withdrawal: -Continue COWS, last score @COWS$ @ -Continue Imodium 2-4 mg PRN diarrhea -Continue Robaxin 500 mg q8 PRN muscle spasms -Continue Naproxen 500 mg BID PRN pain -Continue Zofran-ODT 4 mg q6 PRN nausea -Continue Bentyl 20 mg q6 PRN spasms -Continue Multivitamin daily for nutritional supplementation   MDD: -Restart Prozac at 20 mg daily -Will plan to increase to 40 mg on 2/12.   HTN: -Restart home Amlodipine/Benazepril 2.5/10 mg daily. -Restart Atenolol 50 mg and increase to 100 mg tomorrow   Gout: -Restart home Allopurinol 100 mg daily   -Restart home Protonix EC 20 mg daily -Continue PRN's: Tylenol, Maalox, Atarax, Milk of Magnesia, Trazodone   Dispo: Interested in Residential  Treatment.  Briant Cedar, MD 12/14/2022 7:03 AM

## 2022-12-14 NOTE — ED Notes (Signed)
Pt asleep in bed. Respirations even and unlabored. Monitoring for safety. 

## 2022-12-15 DIAGNOSIS — F1994 Other psychoactive substance use, unspecified with psychoactive substance-induced mood disorder: Secondary | ICD-10-CM | POA: Diagnosis not present

## 2022-12-15 NOTE — ED Notes (Signed)
Pt asleep in bed. Respirations even and unlabored. Monitoring for safety. 

## 2022-12-15 NOTE — ED Notes (Signed)
Patient observed/assessed in bed in room. Patient lying down but awake. Patient alert and oriented x 4. Affect is flat. Patient denies pain and anxiety. Patient very expressive regarding his concerns with the meals that have been here describing the meals as "Fucking atrocious!" when inquiring how his appetite has been and if he has been experiencing any difficulty with GI symptoms. He denies A/V/H. He denies having any thoughts/plan of self harm and harm towards others.  Verbalizes no further complaints at this time. Will continue to monitor and support.

## 2022-12-15 NOTE — ED Notes (Signed)
Patient observed/assessed in room in bed appearing in no immediate distress resting peacefully. Q15 minute checks continued by MHT and nursing staff. Will continue to monitor and support. 

## 2022-12-15 NOTE — ED Provider Notes (Signed)
Behavioral Health Progress Note  Date and Time: 12/15/2022 10:59 AM Name: Tyrone Barnett MRN:  [id number]  Subjective:   Tyrone Barnett is a 52 yr old male who presented to [facility] on 2/9 requesting assistance to detox from Cocaine, he was admitted to [facility] on 2/9.  PPHx is significant for MDD, Cocaine Abuse,  PTSD, and ADHD, and has been to Residential Rehab multiple times in the past.  He reports he still angry at himself.  He reports significant disappointment that he was here yesterday and not at the daddy daughter dance like he was supposed to be.  He reports he is not sure how he left cocaine back into his life again but states he will continue to work on figuring that out so that it does not happen again.  He reports he is still interested in going to residential treatment because he knows it will be helpful he is just unsure of what will happen after that.  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  He reports that it was a good job and went up and down the [place], to [place] and [place] times.  He reports no withdrawal symptoms today.  He reports no cravings.  Discussed that since he tolerated restarting the Prozac well yesterday we will plan to increase to 40 mg tomorrow and he was agreeable with this.  He asked if his Prozac could be further increased and we discussed that after being on 40 mg for a few days this could be done.  He reports no SI, HI, or AVH.  He reports sleep is good.  He reports his appetite is doing good.  He reports no other concerns at present.   Diagnosis:  Final diagnoses:  Substance induced mood disorder (HCC)    Total Time spent with patient: 20 minutes  Past Psychiatric History: MDD, Cocaine Abuse,  PTSD, and ADHD, and has been to Residential Rehab multiple times in the past. Past Medical History: HTN, Gout  Family History: Father-Alcoholism, cousins -drug use  Family Psychiatric  History: Reports no  [place] Social History: Patient is married, has 3 children. 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 Family is "somewhat supportive but disappointed in me"   Additional Social History:    Pain Medications: denies Prescriptions: Prozac, Gabapentin & BP meds- none taken in past 5 days Over the Counter: n/a History of alcohol / drug use?: Yes Longest period of sobriety (when/how long): 15 years Negative Consequences of Use: Financial, Personal relationships, Work / Youth worker Withdrawal Symptoms: Patient aware of relationship between substance abuse and physical/medical complications Name of Substance 1: cocaine 1 - Age of First Use: 22 1 - Amount (size/oz): varies 1 - Frequency: x the past 5 days; 9 months prior he was clean and sober 1 - Duration: x 5 days 1 - Last Use / Amount: unknown binge amount used over past 5 days 1 - Method of Aquiring: from customer 1- Route of Use: smoke and snort Name of Substance 2: Methamphetamine 2 - Age of First Use: 51 2 - Amount (size/oz): unknown 2 - Frequency: over past 5 days 2 - Duration: unknown by pt- he states he doesn't remember using but was told he used methamphetamines during cocaine binge of past week 2 - Last Use / Amount: Unknown- he states he doesn't remember using but was told he used methamphetamines 2 - Method of Aquiring: unknown by patient 2 - Route of Substance Use:  unknown by patient                Sleep: Good   Appetite:  Good  Current Medications:  Current Facility-Administered Medications  Medication Dose Route Frequency Provider Last Rate Last Admin   acetaminophen (TYLENOL) tablet 650 mg  650 mg Oral Q6H PRN Maple Hudson, Veronique M, NP       allopurinol (ZYLOPRIM) tablet 100 mg  100 mg Oral Daily Briant Cedar, MD   100 mg at 12/15/22 0944   alum & mag hydroxide-simeth (MAALOX/MYLANTA) 200-200-20 MG/5ML suspension 30 mL  30 mL Oral Q4H PRN Maple Hudson, Veronique M, NP       amLODipine (NORVASC) tablet 2.5 mg  2.5 mg Oral Daily Briant Cedar, MD   2.5 mg at 12/15/22 [id number]   And   benazepril (LOTENSIN) tablet 10 mg  10 mg Oral Daily Briant Cedar, MD   10 mg at 12/15/22 0944   atenolol (TENORMIN) tablet 100 mg  100 mg Oral Daily Briant Cedar, MD   100 mg at 12/15/22 [id number]   dicyclomine (BENTYL) tablet 20 mg  20 mg Oral Q6H PRN Briant Cedar, MD       [START ON 12/16/2022] FLUoxetine (PROZAC) capsule 40 mg  40 mg Oral Daily Briant Cedar, MD       hydrOXYzine (ATARAX) tablet 25 mg  25 mg Oral TID PRN Haynes Kerns, NP   25 mg at 12/14/22 1755   loperamide (IMODIUM) capsule 2-4 mg  2-4 mg Oral PRN Briant Cedar, MD       magnesium hydroxide (MILK OF MAGNESIA) suspension 30 mL  30 mL Oral Daily PRN Maple Hudson, Veronique M, NP       methocarbamol (ROBAXIN) tablet 500 mg  500 mg Oral Q8H PRN Briant Cedar, MD       multivitamin with minerals tablet 1 tablet  1 tablet Oral Daily Briant Cedar, MD   1 tablet at 12/15/22 0943   naproxen (NAPROSYN) tablet 500 mg  500 mg Oral BID PRN Briant Cedar, MD       ondansetron (ZOFRAN-ODT) disintegrating tablet 4 mg  4 mg Oral Q6H PRN Briant Cedar, MD       pantoprazole (PROTONIX) EC tablet 20 mg  20 mg Oral Daily Briant Cedar, MD   20 mg at 12/15/22 0944   traZODone (DESYREL) tablet 50 mg  50 mg Oral QHS PRN Haynes Kerns, NP       Current Outpatient Medications  Medication Sig Dispense Refill   allopurinol (ZYLOPRIM) 100 MG tablet TAKE ONE TABLET BY MOUTH ONCE DAILY   "NO MORE REFILLS WITHOUT OFFICE VISIT" 90 tablet 0   amLODipine-benazepril (LOTREL) 10-40 MG capsule Take 1 capsule by mouth daily.     aspirin 81 MG tablet Take 81 mg by mouth daily.       atenolol (TENORMIN) 100 MG tablet Take 100 mg by mouth daily.     famotidine (PEPCID) 20 MG tablet Take 20 mg by mouth daily as needed for heartburn or indigestion.     FLUoxetine (PROZAC) 40 MG capsule Take 40 mg by mouth daily.      gabapentin (NEURONTIN) 300 MG capsule Take 300 mg by mouth 3 (three) times daily.      Labs  Lab Results:  Admission on 12/13/2022  Component Date Value Ref Range Status   SARS Coronavirus 2 by RT PCR 12/13/2022 NEGATIVE  NEGATIVE Final   Influenza  A by PCR 12/13/2022 NEGATIVE  NEGATIVE Final   Influenza B by PCR 12/13/2022 NEGATIVE  NEGATIVE Final   Comment: (NOTE) The Xpert Xpress SARS-CoV-2/FLU/RSV plus assay is intended as an aid in the diagnosis of influenza from Nasopharyngeal swab specimens and should not be used as a sole basis for treatment. Nasal washings and aspirates are unacceptable for Xpert Xpress SARS-CoV-2/FLU/RSV testing.  Fact Sheet for Patients: EntrepreneurPulse.com.au  Fact Sheet for Healthcare Providers: IncredibleEmployment.be  This test is not yet approved or cleared by the [place] FDA and has been authorized for detection and/or diagnosis of SARS-CoV-2 by FDA under an Emergency Use Authorization (EUA). This EUA will remain in effect (meaning this test can be used) for the duration of the COVID-19 declaration under Section 564(b)(1) of the Act, 21 U.S.C. section 360bbb-3(b)(1), unless the authorization is terminated or revoked.     Resp Syncytial Virus by PCR 12/13/2022 NEGATIVE  NEGATIVE Final   Comment: (NOTE) Fact Sheet for Patients: EntrepreneurPulse.com.au  Fact Sheet for Healthcare Providers: IncredibleEmployment.be  This test is not yet approved or cleared by the [place] FDA and has been authorized for detection and/or diagnosis of SARS-CoV-2 by FDA under an Emergency Use Authorization (EUA). This EUA will remain in effect (meaning this test can be used) for the duration of the COVID-19 declaration under Section 564(b)(1) of the Act, 21 U.S.C. section 360bbb-3(b)(1), unless the authorization is terminated or revoked.  Performed at [facility],  [place] [address] [place], [place] [address]    Color, Urine 12/14/2022 AMBER (A)  YELLOW Final   BIOCHEMICALS MAY BE AFFECTED BY COLOR   APPearance 12/14/2022 HAZY (A)  CLEAR Final   Specific Gravity, Urine 12/14/2022 1.030  1.005 - 1.030 Final   pH 12/14/2022 5.0  5.0 - 8.0 Final   Glucose, UA 12/14/2022 NEGATIVE  NEGATIVE mg/dL Final   Hgb urine dipstick 12/14/2022 NEGATIVE  NEGATIVE Final   Bilirubin Urine 12/14/2022 SMALL (A)  NEGATIVE Final   Ketones, ur 12/14/2022 5 (A)  NEGATIVE mg/dL Final   Protein, ur 12/14/2022 30 (A)  NEGATIVE mg/dL Final   Nitrite 12/14/2022 NEGATIVE  NEGATIVE Final   Leukocytes,Ua 12/14/2022 NEGATIVE  NEGATIVE Final   RBC / HPF 12/14/2022 21-50  0 - 5 RBC/hpf Final   WBC, UA 12/14/2022 0-5  0 - 5 WBC/hpf Final   Bacteria, UA 12/14/2022 NONE SEEN  NONE SEEN Final   Squamous Epithelial / HPF 12/14/2022 0-5  0 - 5 /HPF Final   Mucus 12/14/2022 PRESENT   Final   Performed at [facility], [place] [address], [place], [place] [address]   POC Amphetamine UR 12/14/2022 Positive (A)  NONE DETECTED (Cut Off Level 1000 ng/mL) Final   POC Secobarbital (BAR) 12/14/2022 None Detected  NONE DETECTED (Cut Off Level 300 ng/mL) Final   POC Buprenorphine (BUP) 12/14/2022 None Detected  NONE DETECTED (Cut Off Level 10 ng/mL) Final   POC Oxazepam (BZO) 12/14/2022 Positive (A)  NONE DETECTED (Cut Off Level 300 ng/mL) Final   POC Cocaine UR 12/14/2022 Positive (A)  NONE DETECTED (Cut Off Level 300 ng/mL) Final   POC Methamphetamine UR 12/14/2022 Positive (A)  NONE DETECTED (Cut Off Level 1000 ng/mL) Final   POC Morphine 12/14/2022 None Detected  NONE DETECTED (Cut Off Level 300 ng/mL) Final   POC Methadone UR 12/14/2022 None Detected  NONE DETECTED (Cut Off Level 300 ng/mL) Final   POC Oxycodone UR 12/14/2022 None Detected  NONE DETECTED (Cut Off Level 100  ng/mL) Final   POC Marijuana UR 12/14/2022 Positive (A)  NONE DETECTED (Cut Off Level 50 ng/mL) Final   SARSCOV2ONAVIRUS 2 AG  12/13/2022 NEGATIVE  NEGATIVE Final   Comment: (NOTE) SARS-CoV-2 antigen NOT DETECTED.   Negative results are presumptive.  Negative results do not preclude SARS-CoV-2 infection and should not be used as the sole basis for treatment or other patient management decisions, including infection  control decisions, particularly in the presence of clinical signs and  symptoms consistent with COVID-19, or in those who have been in contact with the virus.  Negative results must be combined with clinical observations, patient history, and epidemiological information. The expected result is Negative.  Fact Sheet for Patients: HandmadeRecipes.com.cy  Fact Sheet for Healthcare Providers: FuneralLife.at  This test is not yet approved or cleared by the [place] FDA and  has been authorized for detection and/or diagnosis of SARS-CoV-2 by FDA under an Emergency Use Authorization (EUA).  This EUA will remain in effect (meaning this test can be used) for the duration of  the COV                          ID-19 declaration under Section 564(b)(1) of the Act, 21 U.S.C. section 360bbb-3(b)(1), unless the authorization is terminated or revoked sooner.    Admission on 10/02/2022, Discharged on 10/02/2022  Component Date Value Ref Range Status   Sodium 10/02/2022 137  135 - 145 mmol/L Final   Potassium 10/02/2022 4.2  3.5 - 5.1 mmol/L Final   Chloride 10/02/2022 103  98 - 111 mmol/L Final   CO2 10/02/2022 25  22 - 32 mmol/L Final   Glucose, Bld 10/02/2022 106 (H)  70 - 99 mg/dL Final   Glucose reference range applies only to samples taken after fasting for at least 8 hours.   BUN 10/02/2022 15  6 - 20 mg/dL Final   Creatinine, Ser 10/02/2022 0.77  0.61 - 1.24 mg/dL Final   Calcium 10/02/2022 9.1  8.9 - 10.3 mg/dL Final   GFR, Estimated 10/02/2022 >60  >60 mL/min Final   Comment: (NOTE) Calculated using the CKD-EPI Creatinine Equation (2021)     Anion gap 10/02/2022 9  5 - 15 Final   Performed at [facility], [place], [address] [place], [place] [address]   WBC 10/02/2022 7.3  4.0 - 10.5 K/uL Final   RBC 10/02/2022 4.69  4.22 - 5.81 MIL/uL Final   Hemoglobin 10/02/2022 14.0  13.0 - 17.0 g/dL Final   HCT 10/02/2022 41.2  39.0 - 52.0 % Final   MCV 10/02/2022 87.8  80.0 - 100.0 fL Final   MCH 10/02/2022 29.9  26.0 - 34.0 pg Final   MCHC 10/02/2022 34.0  30.0 - 36.0 g/dL Final   RDW 10/02/2022 13.7  11.5 - 15.5 % Final   Platelets 10/02/2022 236  150 - 400 K/uL Final   nRBC 10/02/2022 0.0  0.0 - 0.2 % Final   Performed at [facility], [address] [place], [place] [address]   Troponin I (High Sensitivity) 10/02/2022 3  <18 ng/L Final   Comment: (NOTE) Elevated high sensitivity troponin I (hsTnI) values and significant  changes across serial measurements may suggest ACS but many other  chronic and acute conditions are known to elevate hsTnI results.  Refer to the "Links" section for chest pain algorithms and additional  guidance. Performed at [facility], [address] [place],  [place] [address]    Total Protein 10/02/2022 7.5  6.5 - 8.1 g/dL Final   Albumin 10/02/2022 4.0  3.5 - 5.0 g/dL Final   AST 10/02/2022 23  15 - 41 U/L Final   ALT 10/02/2022 21  0 - 44 U/L Final   Alkaline Phosphatase 10/02/2022 115  38 - 126 U/L Final   Total Bilirubin 10/02/2022 1.0  0.3 - 1.2 mg/dL Final   Bilirubin, Direct 10/02/2022 0.2  0.0 - 0.2 mg/dL Final   Indirect Bilirubin 10/02/2022 0.8  0.3 - 0.9 mg/dL Final   Performed at [facility], [address] [place], [place] [address]   Troponin I (High Sensitivity) 10/02/2022 3  <18 ng/L Final   Comment: (NOTE) Elevated high sensitivity troponin I (hsTnI) values and significant  changes across serial measurements may suggest ACS but many other  chronic and acute conditions are known to elevate hsTnI results.  Refer to the "Links" section for  chest pain algorithms and additional  guidance. Performed at [facility], [address] [place], [place] [address]     Blood Alcohol level:  No results found for: "ETH"  Metabolic Disorder Labs: No results found for: "HGBA1C", "MPG" No results found for: "PROLACTIN" Lab Results  Component Value Date   CHOL 177 10/06/2014   TRIG 303 (H) 10/06/2014   HDL 36 (L) 10/06/2014   CHOLHDL 4.9 10/06/2014   VLDL 61 (H) 10/06/2014   LDLCALC 80 10/06/2014    Therapeutic Lab Levels: No results found for: "LITHIUM" No results found for: "VALPROATE" No results found for: "CBMZ"  Physical Findings   PHQ2-9    [place] ED from 12/13/2022 in [facility] Office Visit from 10/06/2014 in Primary Care at [facility] Total Score 2 0  PHQ-9 Total Score 7 --      Flowsheet Row ED from 12/13/2022 in [facility] ED from 10/02/2022 in [facility] Emergency Department at [facility] ED from 05/12/2022 in [facility] Emergency Department at [place] No Risk No Risk        Musculoskeletal  Strength & Muscle Tone: within normal limits Gait & Station: normal Patient leans: N/A  Psychiatric Specialty Exam  Presentation  General Appearance:  Appropriate for Environment; Casual  Eye Contact: Good  Speech: Clear and Coherent; Normal Rate  Speech Volume: Normal  Handedness: Right   Mood and Affect  Mood: Depressed; Anxious  Affect: Constricted; Depressed   Thought Process  Thought Processes: Coherent; Goal Directed  Descriptions of Associations:Intact Orientation:Full (Time, Place and Person)  Thought Content:WDL; Logical  Diagnosis of Schizophrenia or Schizoaffective disorder in past: No    Hallucinations:Hallucinations: None  Ideas of Reference:None  Suicidal Thoughts:Suicidal Thoughts: No  Homicidal Thoughts:Homicidal Thoughts:  No   Sensorium  Memory: Immediate Good; Recent Good  Judgment: Fair  Insight: Fair   Community education officer  Concentration: Good  Attention Span: Good  Recall: Good  Fund of Knowledge: Good  Language: Good   Psychomotor Activity  Psychomotor Activity: Psychomotor Activity: Normal   Assets  Assets: Communication Skills; Desire for Improvement; Financial Resources/Insurance; Resilience; Social Support   Sleep  Sleep: Sleep: Good   No data recorded   Physical Exam  Physical Exam Vitals and nursing note reviewed.  Constitutional:      General: He is not in acute distress.    Appearance: Normal appearance. He is obese. He is not ill-appearing  or toxic-appearing.  HENT:     Head: Normocephalic and atraumatic.  Pulmonary:     Effort: Pulmonary effort is normal.  Musculoskeletal:        General: Normal range of motion.  Neurological:     General: No focal deficit present.     Mental Status: He is alert.    Review of Systems  Respiratory:  Negative for cough and shortness of breath.   Cardiovascular:  Negative for chest pain.  Gastrointestinal:  Negative for abdominal pain, constipation, diarrhea, nausea and vomiting.  Neurological:  Negative for dizziness, weakness and headaches.  Psychiatric/Behavioral:  Positive for depression. Negative for hallucinations and suicidal ideas. The patient is nervous/anxious.    Blood pressure (!) 150/84, pulse 70, temperature 98.1 F (36.7 C), temperature source Oral, resp. rate 18, SpO2 96 %. There is no height or weight on file to calculate BMI.  Treatment Plan Summary: Daily contact with patient to assess and evaluate symptoms and progress in treatment and Medication management   Tyrone Barnett is a 52 yr old male who presented to [facility] on 2/9 requesting assistance to detox from Cocaine, he was admitted to [facility] on 2/9.  PPHx is significant for MDD, Cocaine Abuse,  PTSD, and ADHD, and has been to Residential Rehab  multiple times in the past.   Tyrone Barnett continues to tolerate detox well, however, he does continue to have significant depression around his substance use and its impact on his family/life.  He tolerated restarting Prozac well yesterday so we will continue with the plan to increase to 40 mg tomorrow (2/12) and will consider further increasing at a later date.  We will increase his atenolol today to his full home dose today. Make any other changes to his medications at this time.  He continues to want to do residential rehab.  We will continue to monitor.   Withdrawal: -Continue COWS, last score= 0  @ 0615  2/11 -Continue Imodium 2-4 mg PRN diarrhea -Continue Robaxin 500 mg q8 PRN muscle spasms -Continue Naproxen 500 mg BID PRN pain -Continue Zofran-ODT 4 mg q6 PRN nausea -Continue Bentyl 20 mg q6 PRN spasms -Continue Multivitamin daily for nutritional supplementation   MDD: -Continue Prozac at 20 mg daily -Will plan to increase to 40 mg on 2/12.   HTN: -Continue home Amlodipine/Benazepril 2.5/10 mg daily. -Increase Atenolol to 100 mg daily today   Gout: -Continue home Allopurinol 100 mg daily   -Continue home Protonix EC 20 mg daily -Continue PRN's: Tylenol, Maalox, Atarax, Milk of Magnesia, Trazodone   Dispo: Interested in Residential Treatment.   Briant Cedar, MD 12/15/2022 10:59 AM

## 2022-12-15 NOTE — ED Notes (Signed)
Patient was awake and alert on unit for breakfast.  He has since returned to room and bed and is resting without complaint or distress at this time. Patient is calm, pleasant and organized.  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  Patient encouraged to seek out staff to have needs met.  Will monitor and provide a safe environment.

## 2022-12-16 DIAGNOSIS — F909 Attention-deficit hyperactivity disorder, unspecified type: Secondary | ICD-10-CM | POA: Diagnosis not present

## 2022-12-16 DIAGNOSIS — F4310 Post-traumatic stress disorder, unspecified: Secondary | ICD-10-CM | POA: Diagnosis not present

## 2022-12-16 DIAGNOSIS — Z79899 Other long term (current) drug therapy: Secondary | ICD-10-CM | POA: Diagnosis not present

## 2022-12-16 DIAGNOSIS — F1414 Cocaine abuse with cocaine-induced mood disorder: Secondary | ICD-10-CM | POA: Diagnosis not present

## 2022-12-16 DIAGNOSIS — F329 Major depressive disorder, single episode, unspecified: Secondary | ICD-10-CM | POA: Diagnosis not present

## 2022-12-16 DIAGNOSIS — F1994 Other psychoactive substance use, unspecified with psychoactive substance-induced mood disorder: Secondary | ICD-10-CM | POA: Diagnosis not present

## 2022-12-16 DIAGNOSIS — Z91148 Patient's other noncompliance with medication regimen for other reason: Secondary | ICD-10-CM | POA: Diagnosis not present

## 2022-12-16 DIAGNOSIS — Z5902 Unsheltered homelessness: Secondary | ICD-10-CM | POA: Diagnosis not present

## 2022-12-16 DIAGNOSIS — Z1152 Encounter for screening for COVID-19: Secondary | ICD-10-CM | POA: Diagnosis not present

## 2022-12-16 LAB — COMPREHENSIVE METABOLIC PANEL
ALT: 59 U/L — ABNORMAL HIGH (ref 0–44)
AST: 26 U/L (ref 15–41)
Albumin: 3.8 g/dL (ref 3.5–5.0)
Alkaline Phosphatase: 110 U/L (ref 38–126)
Anion gap: 8 (ref 5–15)
BUN: 9 mg/dL (ref 6–20)
CO2: 26 mmol/L (ref 22–32)
Calcium: 9.4 mg/dL (ref 8.9–10.3)
Chloride: 104 mmol/L (ref 98–111)
Creatinine, Ser: 0.79 mg/dL (ref 0.61–1.24)
GFR, Estimated: >60 mL/min (ref >60)
Glucose, Bld: 114 mg/dL — ABNORMAL HIGH (ref 70–99)
Potassium: 3.9 mmol/L (ref 3.5–5.1)
Sodium: 138 mmol/L (ref 135–145)
Total Bilirubin: 1 mg/dL (ref 0.3–1.2)
Total Protein: 6.9 g/dL (ref 6.5–8.1)

## 2022-12-16 LAB — CBC WITH DIFFERENTIAL/PLATELET
Abs Immature Granulocytes: 0.03 10*3/uL (ref 0.00–0.07)
Basophils Absolute: 0 10*3/uL (ref 0.0–0.1)
Basophils Relative: 0 %
Eosinophils Absolute: 0.1 10*3/uL (ref 0.0–0.5)
Eosinophils Relative: 1 %
HCT: 41.9 % (ref 39.0–52.0)
Hemoglobin: 14.7 g/dL (ref 13.0–17.0)
Immature Granulocytes: 0 %
Lymphocytes Relative: 23 %
Lymphs Abs: 1.8 10*3/uL (ref 0.7–4.0)
MCH: 30 pg (ref 26.0–34.0)
MCHC: 35.1 g/dL (ref 30.0–36.0)
MCV: 85.5 fL (ref 80.0–100.0)
Monocytes Absolute: 0.7 10*3/uL (ref 0.1–1.0)
Monocytes Relative: 9 %
Neutro Abs: 5.5 10*3/uL (ref 1.7–7.7)
Neutrophils Relative %: 67 %
Platelets: 252 10*3/uL (ref 150–400)
RBC: 4.9 MIL/uL (ref 4.22–5.81)
RDW: 13.6 % (ref 11.5–15.5)
WBC: 8.1 10*3/uL (ref 4.0–10.5)
nRBC: 0 % (ref 0.0–0.2)

## 2022-12-16 LAB — LIPID PANEL
Cholesterol: 197 mg/dL (ref 0–200)
HDL: 26 mg/dL — ABNORMAL LOW (ref >40)
LDL Cholesterol: 123 mg/dL — ABNORMAL HIGH (ref 0–99)
Total CHOL/HDL Ratio: 7.6 RATIO
Triglycerides: 240 mg/dL — ABNORMAL HIGH (ref <150)
VLDL: 48 mg/dL — ABNORMAL HIGH (ref 0–40)

## 2022-12-16 LAB — HEMOGLOBIN A1C
Hgb A1c MFr Bld: 5 % (ref 4.8–5.6)
Mean Plasma Glucose: 96.8 mg/dL

## 2022-12-16 LAB — TSH: TSH: 0.749 u[IU]/mL (ref 0.350–4.500)

## 2022-12-16 LAB — MAGNESIUM: Magnesium: 2.3 mg/dL (ref 1.7–2.4)

## 2022-12-16 MED ORDER — PANTOPRAZOLE SODIUM 20 MG PO TBEC: 20.0000 mg | DELAYED_RELEASE_TABLET | Freq: Every day | ORAL | 0 refills | Status: DC

## 2022-12-16 MED ORDER — ATENOLOL 100 MG PO TABS: 100.0000 mg | ORAL_TABLET | Freq: Every day | ORAL | 0 refills | Status: DC

## 2022-12-16 MED ORDER — FLUOXETINE HCL 40 MG PO CAPS: 40.0000 mg | ORAL_CAPSULE | Freq: Every day | ORAL | 0 refills | Status: DC

## 2022-12-16 MED ORDER — AMLODIPINE BESY-BENAZEPRIL HCL 10-40 MG PO CAPS: 1.0000 | ORAL_CAPSULE | Freq: Every day | ORAL | 0 refills | Status: DC

## 2022-12-16 MED ORDER — TRAZODONE HCL 50 MG PO TABS: 50.0000 mg | ORAL_TABLET | Freq: Every evening | ORAL | 0 refills | Status: DC | PRN

## 2022-12-16 MED ORDER — BUPROPION HCL ER (XL) 150 MG PO TB24
150.0000 mg | ORAL_TABLET | Freq: Every day | ORAL | Status: DC
  Administered 2022-12-16 – 2022-12-17 (×2): 150 mg via ORAL
  Filled 2022-12-16 (×2): qty 1

## 2022-12-16 MED ORDER — HYDROXYZINE HCL 25 MG PO TABS: 25.0000 mg | ORAL_TABLET | Freq: Three times a day (TID) | ORAL | 0 refills | Status: DC | PRN

## 2022-12-16 MED ORDER — GABAPENTIN 300 MG PO CAPS
300.0000 mg | ORAL_CAPSULE | Freq: Two times a day (BID) | ORAL | Status: DC
  Administered 2022-12-16 – 2022-12-17 (×3): 300 mg via ORAL
  Filled 2022-12-16 (×3): qty 1

## 2022-12-16 MED ORDER — ADULT MULTIVITAMIN W/MINERALS CH: 1.0000 | ORAL_TABLET | Freq: Every day | ORAL | Status: DC

## 2022-12-16 MED ORDER — GABAPENTIN 300 MG PO CAPS: 300.0000 mg | ORAL_CAPSULE | Freq: Two times a day (BID) | ORAL | 0 refills | Status: DC

## 2022-12-16 MED ORDER — BUPROPION HCL ER (XL) 150 MG PO TB24: 150.0000 mg | ORAL_TABLET | Freq: Every day | ORAL | 0 refills | Status: DC

## 2022-12-16 MED ORDER — ALLOPURINOL 100 MG PO TABS: 100.0000 mg | ORAL_TABLET | Freq: Every day | ORAL | 0 refills | Status: DC

## 2022-12-16 NOTE — ED Notes (Signed)
Patient observed/assessed in dayroom. Patient alert and oriented x 4. Affect is flat.Patient denies pain and anxiety. He denies A/V/H. He denies having any thoughts/plan of self harm and harm towards others. Fluid and snack offered. Patient states that appetite has been good throughout the day. Verbalizes no further complaints at this time. Will continue to monitor and support.

## 2022-12-16 NOTE — ED Provider Notes (Signed)
FBC Progress Note  Date and Time: 12/16/2022 8:34 AM Name: Tyrone Barnett MRN:  [id number]  Reason for Admission:   Tyrone Barnett is a 52 yr old male who presented to [facility] on 2/9 requesting assistance to detox from Cocaine, he was admitted to [facility] on 2/9.  PPHx is significant for MDD, Cocaine Abuse,  PTSD, and ADHD, and has been to Residential Rehab multiple times in the past.  Subjective: He reports he still angry at himself and feeling the same. He reports no withdrawal symptoms today.  He reports no cravings. He reports physically doing well but emotionally distraught over his substance use and how it affects his children and wife. He reports he formally was on Ritalin for ADHD until age of 80 where he self-discontinued.   We discussed resuming gabapentin and starting bupropion to aid with concentration and supplement treatment of depression. He reports no SI, HI, or AVH.  He reports sleep is good.  He reports his appetite is doing good.  He reports no other concerns at present.  He continues to seek residential substance treatment. He is motivated to quit substance use.   Diagnosis:  Final diagnoses:  Substance induced mood disorder (HCC)    Total Time spent with patient: 20 minutes  Past Psychiatric History: MDD, Cocaine Abuse,  PTSD, and ADHD, and has been to Residential Rehab multiple times in the past. Past Medical History: HTN, Gout  Family History: Father-Alcoholism, cousins -drug use  Family Psychiatric  History: Reports no [place] Social History: Patient is married, has 3 children. 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 Family is "somewhat supportive but disappointed in me"   Additional Social History:    Pain Medications: denies Prescriptions: Prozac, Gabapentin & BP meds- none taken in past 5 days Over the Counter: n/a History of alcohol / drug use?: Yes Longest period of sobriety (when/how long): 15 years Negative Consequences of Use: Financial, Personal relationships, Work / Youth worker Withdrawal  Symptoms: Patient aware of relationship between substance abuse and physical/medical complications Name of Substance 1: cocaine 1 - Age of First Use: 22 1 - Amount (size/oz): varies 1 - Frequency: x the past 5 days; 9 months prior he was clean and sober 1 - Duration: x 5 days 1 - Last Use / Amount: unknown binge amount used over past 5 days 1 - Method of Aquiring: from customer 1- Route of Use: smoke and snort Name of Substance 2: Methamphetamine 2 - Age of First Use: 51 2 - Amount (size/oz): unknown 2 - Frequency: over past 5 days 2 - Duration: unknown by pt- he states he doesn't remember using but was told he used methamphetamines during cocaine binge of past week 2 - Last Use / Amount: Unknown- he states he doesn't remember using but was told he used methamphetamines 2 - Method of Aquiring: unknown by patient 2 - Route of Substance Use: unknown by patient                Sleep: Good   Appetite:  Good  Current Medications:  Current Facility-Administered Medications  Medication Dose Route Frequency Provider Last Rate Last Admin   acetaminophen (TYLENOL) tablet 650 mg  650 mg Oral Q6H PRN Maple Hudson, Veronique M, NP       allopurinol (ZYLOPRIM) tablet 100 mg  100 mg Oral Daily Briant Cedar, MD   100 mg at 12/15/22 0944   alum & mag hydroxide-simeth (MAALOX/MYLANTA) 200-200-20 MG/5ML suspension 30 mL  30 mL Oral Q4H PRN Haynes Kerns, NP  amLODipine (NORVASC) tablet 2.5 mg  2.5 mg Oral Daily Briant Cedar, MD   2.5 mg at 12/15/22 [id number]   And   benazepril (LOTENSIN) tablet 10 mg  10 mg Oral Daily Briant Cedar, MD   10 mg at 12/15/22 0944   atenolol (TENORMIN) tablet 100 mg  100 mg Oral Daily Briant Cedar, MD   100 mg at 12/15/22 [id number]   dicyclomine (BENTYL) tablet 20 mg  20 mg Oral Q6H PRN Briant Cedar, MD       FLUoxetine (PROZAC) capsule 40 mg  40 mg Oral Daily Briant Cedar, MD       hydrOXYzine (ATARAX) tablet 25  mg  25 mg Oral TID PRN Haynes Kerns, NP   25 mg at 12/15/22 1701   loperamide (IMODIUM) capsule 2-4 mg  2-4 mg Oral PRN Briant Cedar, MD       magnesium hydroxide (MILK OF MAGNESIA) suspension 30 mL  30 mL Oral Daily PRN Maple Hudson, Veronique M, NP       methocarbamol (ROBAXIN) tablet 500 mg  500 mg Oral Q8H PRN Briant Cedar, MD       multivitamin with minerals tablet 1 tablet  1 tablet Oral Daily Briant Cedar, MD   1 tablet at 12/15/22 0943   naproxen (NAPROSYN) tablet 500 mg  500 mg Oral BID PRN Briant Cedar, MD       ondansetron (ZOFRAN-ODT) disintegrating tablet 4 mg  4 mg Oral Q6H PRN Briant Cedar, MD       pantoprazole (PROTONIX) EC tablet 20 mg  20 mg Oral Daily Briant Cedar, MD   20 mg at 12/15/22 0944   traZODone (DESYREL) tablet 50 mg  50 mg Oral QHS PRN Haynes Kerns, NP       Current Outpatient Medications  Medication Sig Dispense Refill   allopurinol (ZYLOPRIM) 100 MG tablet TAKE ONE TABLET BY MOUTH ONCE DAILY   "NO MORE REFILLS WITHOUT OFFICE VISIT" 90 tablet 0   amLODipine-benazepril (LOTREL) 10-40 MG capsule Take 1 capsule by mouth daily.     aspirin 81 MG tablet Take 81 mg by mouth daily.       atenolol (TENORMIN) 100 MG tablet Take 100 mg by mouth daily.     famotidine (PEPCID) 20 MG tablet Take 20 mg by mouth daily as needed for heartburn or indigestion.     FLUoxetine (PROZAC) 40 MG capsule Take 40 mg by mouth daily.     gabapentin (NEURONTIN) 300 MG capsule Take 300 mg by mouth 3 (three) times daily.      Labs  Lab Results:  Admission on 12/13/2022  Component Date Value Ref Range Status   SARS Coronavirus 2 by RT PCR 12/13/2022 NEGATIVE  NEGATIVE Final   Influenza A by PCR 12/13/2022 NEGATIVE  NEGATIVE Final   Influenza B by PCR 12/13/2022 NEGATIVE  NEGATIVE Final   Comment: (NOTE) The Xpert Xpress SARS-CoV-2/FLU/RSV plus assay is intended as an aid in the diagnosis of influenza from Nasopharyngeal  swab specimens and should not be used as a sole basis for treatment. Nasal washings and aspirates are unacceptable for Xpert Xpress SARS-CoV-2/FLU/RSV testing.  Fact Sheet for Patients: EntrepreneurPulse.com.au  Fact Sheet for Healthcare Providers: IncredibleEmployment.be  This test is not yet approved or cleared by the [place] FDA and has been authorized for detection and/or diagnosis of SARS-CoV-2 by FDA under an Emergency Use Authorization (EUA). This EUA will remain in effect (meaning  this test can be used) for the duration of the COVID-19 declaration under Section 564(b)(1) of the Act, 21 U.S.C. section 360bbb-3(b)(1), unless the authorization is terminated or revoked.     Resp Syncytial Virus by PCR 12/13/2022 NEGATIVE  NEGATIVE Final   Comment: (NOTE) Fact Sheet for Patients: EntrepreneurPulse.com.au  Fact Sheet for Healthcare Providers: IncredibleEmployment.be  This test is not yet approved or cleared by the [place] FDA and has been authorized for detection and/or diagnosis of SARS-CoV-2 by FDA under an Emergency Use Authorization (EUA). This EUA will remain in effect (meaning this test can be used) for the duration of the COVID-19 declaration under Section 564(b)(1) of the Act, 21 U.S.C. section 360bbb-3(b)(1), unless the authorization is terminated or revoked.  Performed at [facility], [place] [address] [place], [place] [address]    Color, Urine 12/14/2022 AMBER (A)  YELLOW Final   BIOCHEMICALS MAY BE AFFECTED BY COLOR   APPearance 12/14/2022 HAZY (A)  CLEAR Final   Specific Gravity, Urine 12/14/2022 1.030  1.005 - 1.030 Final   pH 12/14/2022 5.0  5.0 - 8.0 Final   Glucose, UA 12/14/2022 NEGATIVE  NEGATIVE mg/dL Final   Hgb urine dipstick 12/14/2022 NEGATIVE  NEGATIVE Final   Bilirubin Urine 12/14/2022 SMALL (A)  NEGATIVE Final   Ketones, ur 12/14/2022 5 (A)  NEGATIVE mg/dL  Final   Protein, ur 12/14/2022 30 (A)  NEGATIVE mg/dL Final   Nitrite 12/14/2022 NEGATIVE  NEGATIVE Final   Leukocytes,Ua 12/14/2022 NEGATIVE  NEGATIVE Final   RBC / HPF 12/14/2022 21-50  0 - 5 RBC/hpf Final   WBC, UA 12/14/2022 0-5  0 - 5 WBC/hpf Final   Bacteria, UA 12/14/2022 NONE SEEN  NONE SEEN Final   Squamous Epithelial / HPF 12/14/2022 0-5  0 - 5 /HPF Final   Mucus 12/14/2022 PRESENT   Final   Performed at [facility], [place] [address], [place], [place] [address]   POC Amphetamine UR 12/14/2022 Positive (A)  NONE DETECTED (Cut Off Level 1000 ng/mL) Final   POC Secobarbital (BAR) 12/14/2022 None Detected  NONE DETECTED (Cut Off Level 300 ng/mL) Final   POC Buprenorphine (BUP) 12/14/2022 None Detected  NONE DETECTED (Cut Off Level 10 ng/mL) Final   POC Oxazepam (BZO) 12/14/2022 Positive (A)  NONE DETECTED (Cut Off Level 300 ng/mL) Final   POC Cocaine UR 12/14/2022 Positive (A)  NONE DETECTED (Cut Off Level 300 ng/mL) Final   POC Methamphetamine UR 12/14/2022 Positive (A)  NONE DETECTED (Cut Off Level 1000 ng/mL) Final   POC Morphine 12/14/2022 None Detected  NONE DETECTED (Cut Off Level 300 ng/mL) Final   POC Methadone UR 12/14/2022 None Detected  NONE DETECTED (Cut Off Level 300 ng/mL) Final   POC Oxycodone UR 12/14/2022 None Detected  NONE DETECTED (Cut Off Level 100 ng/mL) Final   POC Marijuana UR 12/14/2022 Positive (A)  NONE DETECTED (Cut Off Level 50 ng/mL) Final   SARSCOV2ONAVIRUS 2 AG 12/13/2022 NEGATIVE  NEGATIVE Final   Comment: (NOTE) SARS-CoV-2 antigen NOT DETECTED.   Negative results are presumptive.  Negative results do not preclude SARS-CoV-2 infection and should not be used as the sole basis for treatment or other patient management decisions, including infection  control decisions, particularly in the presence of clinical signs and  symptoms consistent with COVID-19, or in those who have been in contact with the virus.  Negative results must be combined  with clinical observations, patient history, and epidemiological information. The expected result is Negative.  Fact Sheet for Patients: HandmadeRecipes.com.cy  Fact Sheet for Healthcare Providers: FuneralLife.at  This test is not yet approved or cleared by the [place] FDA and  has been authorized for detection and/or diagnosis of SARS-CoV-2 by FDA under an Emergency Use Authorization (EUA).  This EUA will remain in effect (meaning this test can be used) for the duration of  the COV                          ID-19 declaration under Section 564(b)(1) of the Act, 21 U.S.C. section 360bbb-3(b)(1), unless the authorization is terminated or revoked sooner.    Admission on 10/02/2022, Discharged on 10/02/2022  Component Date Value Ref Range Status   Sodium 10/02/2022 137  135 - 145 mmol/L Final   Potassium 10/02/2022 4.2  3.5 - 5.1 mmol/L Final   Chloride 10/02/2022 103  98 - 111 mmol/L Final   CO2 10/02/2022 25  22 - 32 mmol/L Final   Glucose, Bld 10/02/2022 106 (H)  70 - 99 mg/dL Final   Glucose reference range applies only to samples taken after fasting for at least 8 hours.   BUN 10/02/2022 15  6 - 20 mg/dL Final   Creatinine, Ser 10/02/2022 0.77  0.61 - 1.24 mg/dL Final   Calcium 10/02/2022 9.1  8.9 - 10.3 mg/dL Final   GFR, Estimated 10/02/2022 >60  >60 mL/min Final   Comment: (NOTE) Calculated using the CKD-EPI Creatinine Equation (2021)    Anion gap 10/02/2022 9  5 - 15 Final   Performed at [facility], [address] [place], [place] [address]   WBC 10/02/2022 7.3  4.0 - 10.5 K/uL Final   RBC 10/02/2022 4.69  4.22 - 5.81 MIL/uL Final   Hemoglobin 10/02/2022 14.0  13.0 - 17.0 g/dL Final   HCT 10/02/2022 41.2  39.0 - 52.0 % Final   MCV 10/02/2022 87.8  80.0 - 100.0 fL Final   MCH 10/02/2022 29.9  26.0 - 34.0 pg Final   MCHC 10/02/2022 34.0  30.0 - 36.0 g/dL Final   RDW 10/02/2022 13.7  11.5 - 15.5 % Final    Platelets 10/02/2022 236  150 - 400 K/uL Final   nRBC 10/02/2022 0.0  0.0 - 0.2 % Final   Performed at [facility], [address] [place], [place] [address]   Troponin I (High Sensitivity) 10/02/2022 3  <18 ng/L Final   Comment: (NOTE) Elevated high sensitivity troponin I (hsTnI) values and significant  changes across serial measurements may suggest ACS but many other  chronic and acute conditions are known to elevate hsTnI results.  Refer to the "Links" section for chest pain algorithms and additional  guidance. Performed at [facility], [address] [place], [place] [address]    Total Protein 10/02/2022 7.5  6.5 - 8.1 g/dL Final   Albumin 10/02/2022 4.0  3.5 - 5.0 g/dL Final   AST 10/02/2022 23  15 - 41 U/L Final   ALT 10/02/2022 21  0 - 44 U/L Final   Alkaline Phosphatase 10/02/2022 115  38 - 126 U/L Final   Total Bilirubin 10/02/2022 1.0  0.3 - 1.2 mg/dL Final   Bilirubin, Direct 10/02/2022 0.2  0.0 - 0.2 mg/dL Final   Indirect Bilirubin 10/02/2022 0.8  0.3 - 0.9 mg/dL Final   Performed at [facility], [address] [place], [place] [address]   Troponin I (High  Sensitivity) 10/02/2022 3  <18 ng/L Final   Comment: (NOTE) Elevated high sensitivity troponin I (hsTnI) values and significant  changes across serial measurements may suggest ACS but many other  chronic and acute conditions are known to elevate hsTnI results.  Refer to the "Links" section for chest pain algorithms and additional  guidance. Performed at [facility], [address] [place], [place] [address]     Blood Alcohol level:  No results found for: "ETH"  Metabolic Disorder Labs: No results found for: "HGBA1C", "MPG" No results found for: "PROLACTIN" Lab Results  Component Value Date   CHOL 177 10/06/2014   TRIG 303 (H) 10/06/2014   HDL 36 (L) 10/06/2014   CHOLHDL 4.9 10/06/2014   VLDL 61 (H) 10/06/2014   LDLCALC 80 10/06/2014    Therapeutic Lab  Levels: No results found for: "LITHIUM" No results found for: "VALPROATE" No results found for: "CBMZ"  Physical Findings   PHQ2-9    [facility] ED from 12/13/2022 in [facility] Office Visit from 10/06/2014 in Primary Care at [facility] Total Score 2 0  PHQ-9 Total Score 7 --      Flowsheet Row ED from 12/13/2022 in [facility] ED from 10/02/2022 in [facility] Emergency Department at [facility] ED from 05/12/2022 in [facility] Emergency Department at [place] No Risk No Risk        Musculoskeletal  Strength & Muscle Tone: within normal limits Gait & Station: normal Patient leans: N/A  Psychiatric Specialty Exam  Presentation  General Appearance:  Appropriate for Environment; Casual  Eye Contact: Good  Speech: Clear and Coherent; Normal Rate  Speech Volume: Normal  Handedness: Right   Mood and Affect  Mood: Depressed; Anxious  Affect: Constricted; Depressed   Thought Process  Thought Processes: Coherent; Goal Directed  Descriptions of Associations:Intact Orientation:Full (Time, Place and Person)  Thought Content:WDL; Logical  Diagnosis of Schizophrenia or Schizoaffective disorder in past: No    Hallucinations:Hallucinations: None  Ideas of Reference:None  Suicidal Thoughts:Suicidal Thoughts: No  Homicidal Thoughts:Homicidal Thoughts: No   Sensorium  Memory: Immediate Good; Recent Good  Judgment: Fair  Insight: Fair   Community education officer  Concentration: Good  Attention Span: Good  Recall: Good  Fund of Knowledge: Good  Language: Good   Psychomotor Activity  Psychomotor Activity: Psychomotor Activity: Normal   Assets  Assets: Communication Skills; Desire for Improvement; Financial Resources/Insurance; Resilience; Social Support   Sleep  Sleep: Sleep: Good   No data recorded   Physical Exam   Physical Exam Vitals and nursing note reviewed.  Constitutional:      General: He is not in acute distress.    Appearance: Normal appearance. He is obese. He is not ill-appearing or toxic-appearing.  HENT:     Head: Normocephalic and atraumatic.  Pulmonary:     Effort: Pulmonary effort is normal.  Musculoskeletal:        General: Normal range of motion.  Neurological:     General: No focal deficit present.     Mental Status: He is alert.    Review of Systems  Respiratory:  Negative for cough and shortness of breath.   Cardiovascular:  Negative for chest pain.  Gastrointestinal:  Negative for abdominal pain, constipation, diarrhea, nausea and vomiting.  Neurological:  Negative for dizziness, weakness and headaches.  Psychiatric/Behavioral:  Positive for depression. Negative for hallucinations and suicidal ideas. The patient is  nervous/anxious.    Blood pressure (!) 142/78, pulse 62, temperature 98.1 F (36.7 C), temperature source Tympanic, resp. rate 20, SpO2 97 %. There is no height or weight on file to calculate BMI.  Treatment Plan Summary: Daily contact with patient to assess and evaluate symptoms and progress in treatment and Medication management   Tyrone Barnett is a 52 yr old male who presented to [facility] on 2/9 requesting assistance to detox from Cocaine, he was admitted to [facility] on 2/9.  PPHx is significant for MDD, Cocaine Abuse,  PTSD, and ADHD, and has been to Residential Rehab multiple times in the past.   Tyrone Barnett continues to tolerate detox well, however, he does continue to have significant depression around his substance use and its impact on his family/life.  He tolerated restarting Prozac well yesterday so we will continue with the plan to increase to 40 mg tomorrow (2/12) and will consider further increasing at a later date.  We will increase his atenolol today to his full home dose today. Make any other changes to his medications at this time.  He continues to want to  do residential rehab.  We will continue to monitor.   Withdrawal: -Continue COWS, last score= 0  @ 0615  2/11 -Continue Imodium 2-4 mg PRN diarrhea -Continue Robaxin 500 mg q8 PRN muscle spasms -Continue Naproxen 500 mg BID PRN pain -Continue Zofran-ODT 4 mg q6 PRN nausea -Continue Bentyl 20 mg q6 PRN spasms -Continue Multivitamin daily for nutritional supplementation -Continue PRN's: Tylenol, Maalox, Atarax, Milk of Magnesia, Trazodone  MDD Substance induced mood Disorder Hx of ADHD -Increase Prozac to 40 mg daily -Start Wellbutrin XL 150 mg daily to address depression  HTN: -Continue home Amlodipine/Benazepril 2.5/10 mg daily. -Increase Atenolol to 100 mg daily today   Gout: -Continue home Allopurinol 100 mg daily  GERD -Continue home Protonix EC 20 mg daily   Dispo: Interested in Residential Treatment.   France Ravens, MD 12/16/2022 8:34 AM

## 2022-12-16 NOTE — Discharge Instructions (Addendum)
Take all medications as prescribed by his/her mental healthcare provider. Report any adverse effects and or reactions from the medicines to your outpatient provider promptly. Do not engage in alcohol and or illegal drug use while on prescription medicines. In the event of worsening symptoms, call the crisis hotline, 911 and or go to the nearest ED for appropriate evaluation and treatment of symptoms. follow-up with your primary care provider for your other medical issues, concerns and or health care needs.  List of Residential placements:   [facility] in [place]: [phone]; Fax number: [phone]  [place]: [address] [place], [place] [address]; no fee or insurance required; minimum of 2 years; Highly structured; work based; Intake Coordinator is Gerald Stabs [phone]  [facility] in [place], [place]: [phone]; Fax number is [phone]; website: [email or web address]; Requires 3-6 page autobiography; 2 year program (18 months and then 16monthtransitional housing); Admission fee is $300; no insurance needed; work pFacilities managerin [place] FDeltaStaff: RMichel [phone] They have a Men's Regenerations Program 6-980month Free program; There is an initial $300 fee however, they are willing to work with patients regarding that. Application is online.  [facility] HospitalLaStock[facility]ownSCHarveysburgr [phone] [phone] have private insurance or Medicare; 28 day PHP with transitional housing as needed; Housing is $10 a day; groups daily;   [facility] in [place] [phone] number is [phone] Private insurance; Medicare; Medicare Advantage; some Ambetter insurance; Medicare part B is charged and the patient is responsible for $485.00 a month for food and housing ($16 dollars a day). Will provide bus ticket to get to facility; however patient  will have to reimburse for transport; Campus style set up.    GuIntegris [facility] [phone] phone  New Patient Assessment/Therapy Walk-Ins:  Monday and Wednesday: 8 am until slots are full. Every 1st and 2nd Fridays of the month: 1 pm - 5 pm.  NO ASSESSMENT/THERAPY WALK-INS ON TUKeystoneNew Patient Assessment/Medication Management Walk-Ins:  Monday - Friday:  8 am - 11 am.  For all walk-ins, we ask that you arrive by 7:30 am because patients will be seen in the order of arrival.  Availability is limited; therefore, you may not be seen on the same day that you walk-in.  Our goal is to serve and meet the needs of our community to the best of our ability.  SUBSTANCE USE TREATMENT for Medicaid and State Funded/IPRS  [facility]) [phone] phone NOTE: [facility] is no longer offering IOP services.  Serves those who are low-income or have no insurance.  [facility] [address]Hi[phone] [phone] NOTE: Does have Substance Abuse-Intensive Outpatient Program ([facility]as well as transitional housing if eligible.  RHCoal [phone] phone [phone]  [phone]. [address] [phone] phone [phone]  HALFWAY HOUSES:  Friends of [phone] Corporationxfordvacancies.com  Alcohol and Drug Council of [place]: assistance with finding placement [phone] [facility] Centeror [place]: [phone]: alFindScifi.com.ee12 STEP PROGRAMS:  Alcoholics Anonymous of [place] htReportZoo.com.cyNarcotics Anonymous of [place] htGreenScrapbooking.dkAl-Anon of GrRite AidNCAlaskawww.[email or web address]/find-meetings.html  Nar-Anon https://nar-anon.org/find-[email or web address]-meetin

## 2022-12-16 NOTE — ED Notes (Signed)
Patient in dayroom watching TV, Respirations equal and unlabored, skin warm and dry, NAD. No change in assessment or acuity. Q 15 minute safety checks remain in place.

## 2022-12-16 NOTE — ED Notes (Signed)
Offsite distribution for lab pickup called.

## 2022-12-16 NOTE — ED Notes (Signed)
Patient observed/assessed in room in bed appearing in no immediate distress resting peacefully. Q15 minute checks continued by MHT and nursing staff. Will continue to monitor and support. 

## 2022-12-16 NOTE — ED Notes (Signed)
Patient A&Ox4. Patient denies SI/HI and AVH. Patient denies any physical complaints when asked. No acute distress noted. Support and encouragement provided. Routine safety checks conducted according to facility protocol. Encouraged patient to notify staff if thoughts of harm toward self or others arise. Patient verbalize understanding and agreement. Will continue to monitor for safety.
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
## 2022-12-16 NOTE — ED Notes (Signed)
Patient watching TV in day room. Respirations equal and unlabored, skin warm and dry, NAD. No change in assessment or acuity. Q 15 minute safety checks remain in place.   

## 2022-12-16 NOTE — ED Provider Notes (Signed)
FBC/OBS ASAP Discharge Summary  Date and Time: 12/17/2022 Name: Tyrone Barnett  MRN:  [id number]   Discharge Diagnoses:  Final diagnoses:  Substance induced mood disorder ([place])    Subjective: Seen and assessed at bedside. Denies SI/HI/AVH. Feels ready to go to residential rehab.   Stay Summary:  Tyrone Barnett is a 52 yr old male who presented to [facility] on 2/9 requesting assistance to detox from Cocaine, he was admitted to [facility] on 2/9. PPHx is significant for MDD, Cocaine Abuse, PTSD, and ADHD, and has been to Residential Rehab multiple times in the past.   Patient was monitored for acute symptoms of withdrawal from cocaine, benzodiazepene, and methamphetamine. Patient was restarted on prozac and titrated up to 40 mg. He was also started on bupropion as an adjunct for depression and impulse control. He was discharged on 10/17/23 to [facility] residential rehabilitation.  Total Time spent with patient: 45 minutes  Past Psychiatric History: MDD, Cocaine Abuse,  PTSD, and ADHD, and has been to Residential Rehab multiple times in the past. Past Medical History: HTN, Gout  Family History: Father-Alcoholism, cousins -drug use  Family Psychiatric  History: Reports no [place] Social History: Patient is married, has 3 children. 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 Family is "somewhat supportive but disappointed in me"  Tobacco Cessation:  N/A, patient does not currently use tobacco products  Current Medications:  Current Facility-Administered Medications  Medication Dose Route Frequency Provider Last Rate Last Admin   acetaminophen (TYLENOL) tablet 650 mg  650 mg Oral Q6H PRN Maple Hudson, Veronique M, NP       allopurinol (ZYLOPRIM) tablet 100 mg  100 mg Oral Daily Briant Cedar, MD   100 mg at 12/16/22 0912   alum & mag hydroxide-simeth (MAALOX/MYLANTA) 200-200-20 MG/5ML suspension 30 mL  30 mL Oral Q4H PRN Maple Hudson, Veronique M, NP       amLODipine (NORVASC) tablet 2.5 mg  2.5 mg Oral Daily Briant Cedar, MD   2.5  mg at 12/16/22 [id number]   And   benazepril (LOTENSIN) tablet 10 mg  10 mg Oral Daily Briant Cedar, MD   10 mg at 12/16/22 0913   atenolol (TENORMIN) tablet 100 mg  100 mg Oral Daily Briant Cedar, MD   100 mg at 12/16/22 0831   buPROPion (WELLBUTRIN XL) 24 hr tablet 150 mg  150 mg Oral Daily France Ravens, MD   150 mg at 12/16/22 0913   dicyclomine (BENTYL) tablet 20 mg  20 mg Oral Q6H PRN Briant Cedar, MD       FLUoxetine (PROZAC) capsule 40 mg  40 mg Oral Daily Briant Cedar, MD   40 mg at 12/16/22 0912   gabapentin (NEURONTIN) capsule 300 mg  300 mg Oral BID France Ravens, MD   300 mg at 12/16/22 0912   hydrOXYzine (ATARAX) tablet 25 mg  25 mg Oral TID PRN Haynes Kerns, NP   25 mg at 12/15/22 1701   loperamide (IMODIUM) capsule 2-4 mg  2-4 mg Oral PRN Briant Cedar, MD       magnesium hydroxide (MILK OF MAGNESIA) suspension 30 mL  30 mL Oral Daily PRN Maple Hudson, Veronique M, NP       methocarbamol (ROBAXIN) tablet 500 mg  500 mg Oral Q8H PRN Briant Cedar, MD       multivitamin with minerals tablet 1 tablet  1 tablet Oral Daily Briant Cedar, MD   1 tablet at 12/16/22 0913   naproxen (NAPROSYN) tablet  500 mg  500 mg Oral BID PRN Briant Cedar, MD       ondansetron (ZOFRAN-ODT) disintegrating tablet 4 mg  4 mg Oral Q6H PRN Briant Cedar, MD       pantoprazole (PROTONIX) EC tablet 20 mg  20 mg Oral Daily Briant Cedar, MD   20 mg at 12/16/22 0912   traZODone (DESYREL) tablet 50 mg  50 mg Oral QHS PRN Haynes Kerns, NP       Current Outpatient Medications  Medication Sig Dispense Refill   [START ON 12/17/2022] allopurinol (ZYLOPRIM) 100 MG tablet Take 1 tablet (100 mg total) by mouth daily. 30 tablet 0   amLODipine-benazepril (LOTREL) 10-40 MG capsule Take 1 capsule by mouth daily. 30 capsule 0   atenolol (TENORMIN) 100 MG tablet Take 1 tablet (100 mg total) by mouth daily. 30 tablet 0   [START ON 12/17/2022]  buPROPion (WELLBUTRIN XL) 150 MG 24 hr tablet Take 1 tablet (150 mg total) by mouth daily. 30 tablet 0   [START ON 12/17/2022] FLUoxetine (PROZAC) 40 MG capsule Take 1 capsule (40 mg total) by mouth daily. 30 capsule 0   gabapentin (NEURONTIN) 300 MG capsule Take 1 capsule (300 mg total) by mouth 2 (two) times daily. 60 capsule 0   hydrOXYzine (ATARAX) 25 MG tablet Take 1 tablet (25 mg total) by mouth 3 (three) times daily as needed for anxiety (for anxiety). 30 tablet 0   [START ON 12/17/2022] Multiple Vitamin (MULTIVITAMIN WITH MINERALS) TABS tablet Take 1 tablet by mouth daily.     pantoprazole (PROTONIX) 20 MG tablet Take 1 tablet (20 mg total) by mouth daily. 30 tablet 0   traZODone (DESYREL) 50 MG tablet Take 1 tablet (50 mg total) by mouth at bedtime as needed for sleep (for insomnia). 30 tablet 0    PTA Medications:  Facility Ordered Medications  Medication   acetaminophen (TYLENOL) tablet 650 mg   alum & mag hydroxide-simeth (MAALOX/MYLANTA) 200-200-20 MG/5ML suspension 30 mL   magnesium hydroxide (MILK OF MAGNESIA) suspension 30 mL   traZODone (DESYREL) tablet 50 mg   hydrOXYzine (ATARAX) tablet 25 mg   dicyclomine (BENTYL) tablet 20 mg   loperamide (IMODIUM) capsule 2-4 mg   methocarbamol (ROBAXIN) tablet 500 mg   naproxen (NAPROSYN) tablet 500 mg   ondansetron (ZOFRAN-ODT) disintegrating tablet 4 mg   amLODipine (NORVASC) tablet 2.5 mg   And   benazepril (LOTENSIN) tablet 10 mg   pantoprazole (PROTONIX) EC tablet 20 mg   multivitamin with minerals tablet 1 tablet   allopurinol (ZYLOPRIM) tablet 100 mg   [COMPLETED] atenolol (TENORMIN) tablet 50 mg   atenolol (TENORMIN) tablet 100 mg   [COMPLETED] FLUoxetine (PROZAC) capsule 20 mg   Followed by   FLUoxetine (PROZAC) capsule 40 mg   gabapentin (NEURONTIN) capsule 300 mg   buPROPion (WELLBUTRIN XL) 24 hr tablet 150 mg   PTA Medications  Medication Sig   [START ON 12/17/2022] allopurinol (ZYLOPRIM) 100 MG tablet Take 1  tablet (100 mg total) by mouth daily.   amLODipine-benazepril (LOTREL) 10-40 MG capsule Take 1 capsule by mouth daily.   atenolol (TENORMIN) 100 MG tablet Take 1 tablet (100 mg total) by mouth daily.   [START ON 12/17/2022] FLUoxetine (PROZAC) 40 MG capsule Take 1 capsule (40 mg total) by mouth daily.   hydrOXYzine (ATARAX) 25 MG tablet Take 1 tablet (25 mg total) by mouth 3 (three) times daily as needed for anxiety (for anxiety).   traZODone (DESYREL) 50 MG  tablet Take 1 tablet (50 mg total) by mouth at bedtime as needed for sleep (for insomnia).   gabapentin (NEURONTIN) 300 MG capsule Take 1 capsule (300 mg total) by mouth 2 (two) times daily.   [START ON 12/17/2022] Multiple Vitamin (MULTIVITAMIN WITH MINERALS) TABS tablet Take 1 tablet by mouth daily.   [START ON 12/17/2022] buPROPion (WELLBUTRIN XL) 150 MG 24 hr tablet Take 1 tablet (150 mg total) by mouth daily.   pantoprazole (PROTONIX) 20 MG tablet Take 1 tablet (20 mg total) by mouth daily.       12/16/2022   10:48 AM 12/13/2022    2:49 PM 10/06/2014    9:43 AM  Depression screen PHQ 2/9  Decreased Interest 1 1 0  Down, Depressed, Hopeless 1 1 0  PHQ - 2 Score 2 2 0  Altered sleeping  1   Tired, decreased energy  1   Change in appetite  0   Feeling bad or failure about yourself   1   Trouble concentrating  1   Moving slowly or fidgety/restless  1   Suicidal thoughts  0   PHQ-9 Score  7   Difficult doing work/chores  Very difficult     Flowsheet Row ED from 12/13/2022 in [facility] ED from 10/02/2022 in [facility] Department at [facility] ED from 05/12/2022 in [facility] Emergency Department at [place] No Risk No Risk       Musculoskeletal  Strength & Muscle Tone: within normal limits Gait & Station: normal Patient leans: N/A  Psychiatric Specialty Exam  Presentation  General Appearance:  Appropriate for Environment; Casual  Eye  Contact: Good  Speech: Clear and Coherent; Normal Rate  Speech Volume: Normal  Handedness: Right   Mood and Affect  Mood: Depressed; Anxious  Affect: Constricted; Depressed   Thought Process  Thought Processes: Coherent; Goal Directed  Descriptions of Associations:Intact  Orientation:Full (Time, Place and Person)  Thought Content:WDL; Logical  Diagnosis of Schizophrenia or Schizoaffective disorder in past: No    Hallucinations:Hallucinations: None  Ideas of Reference:None  Suicidal Thoughts:Suicidal Thoughts: No  Homicidal Thoughts:Homicidal Thoughts: No   Sensorium  Memory: Immediate Good; Recent Good  Judgment: Fair  Insight: Fair   Community education officer  Concentration: Good  Attention Span: Good  Recall: Good  Fund of Knowledge: Good  Language: Good   Psychomotor Activity  Psychomotor Activity: Psychomotor Activity: Normal   Assets  Assets: Communication Skills; Desire for Improvement; Financial Resources/Insurance; Resilience; Social Support   Sleep  Sleep: Sleep: Good   No data recorded  Physical Exam  Physical Exam Vitals and nursing note reviewed.  Constitutional:      General: He is not in acute distress.    Appearance: He is well-developed.  HENT:     Head: Normocephalic and atraumatic.  Eyes:     Conjunctiva/sclera: Conjunctivae normal.  Cardiovascular:     Rate and Rhythm: Normal rate and regular rhythm.     Heart sounds: No murmur heard. Pulmonary:     Effort: Pulmonary effort is normal. No respiratory distress.     Breath sounds: Normal breath sounds.  Abdominal:     Palpations: Abdomen is soft.     Tenderness: There is no abdominal tenderness.  Musculoskeletal:        General: No swelling.     Cervical back: Neck supple.  Skin:    General: Skin is warm and dry.     Capillary  Refill: Capillary refill takes less than 2 seconds.  Neurological:     Mental Status: He is alert.  Psychiatric:         Mood and Affect: Mood normal.    Review of Systems  Respiratory:  Negative for shortness of breath.   Cardiovascular:  Negative for chest pain.  Gastrointestinal:  Negative for abdominal pain, constipation, diarrhea, heartburn, nausea and vomiting.  Neurological:  Negative for headaches.   Blood pressure (!) 142/78, pulse 62, temperature 98.1 F (36.7 C), temperature source Tympanic, resp. rate 20, SpO2 97 %. There is no height or weight on file to calculate BMI.  Demographic Factors:  Male and Caucasian  Loss Factors: Loss of significant relationship  Historical Factors: Impulsivity  Risk Reduction Factors:   Responsible for children under 24 years of age, Sense of responsibility to family, Living with another person, especially a relative, Positive social support, Positive therapeutic relationship, and Positive coping skills or problem solving skills  Continued Clinical Symptoms:  Alcohol/Substance Abuse/Dependencies More than one psychiatric diagnosis Previous Psychiatric Diagnoses and Treatments  Cognitive Features That Contribute To Risk:  None    Suicide Risk:  Mild:  There are no identifiable plans, no associated intent, mild dysphoria and related symptoms, good self-control (both objective and subjective assessment), few other risk factors, and identifiable protective factors, including available and accessible social support.  Plan Of Care/Follow-up recommendations:   Follow-up recommendations:   Activity:  as tolerated Diet:  heart healthy   Comments:  Prescriptions were given at discharge.  Patient is agreeable with the discharge plan.  Patient was given an opportunity to ask questions.  Patient appears to feel comfortable with discharge and denies any current suicidal or homicidal thoughts.    Patient is instructed prior to discharge to: Take all medications as prescribed by mental healthcare provider. Report any adverse effects and or reactions from the  medicines to outpatient provider promptly. In the event of worsening symptoms, patient is instructed to call the crisis hotline, 911 and or go to the nearest ED for appropriate evaluation and treatment of symptoms. Patient is to follow-up with primary care provider for other medical issues, concerns and or health care needs.   France Ravens, MD 12/16/2022, 3:42 PM

## 2022-12-16 NOTE — ED Notes (Signed)
Patient resting quietly in bed with eyes closed. Respirations equal and unlabored, skin warm and dry, NAD. Routine safety checks conducted according to facility protocol. Will continue to monitor for safety.  

## 2022-12-17 DIAGNOSIS — F1994 Other psychoactive substance use, unspecified with psychoactive substance-induced mood disorder: Secondary | ICD-10-CM | POA: Diagnosis not present

## 2022-12-17 NOTE — ED Notes (Signed)
Patient A&O x 4, ambulatory. Patient discharged in no acute distress. Patient denied SI/HI, A/VH upon discharge. Patient verbalized understanding of all discharge instructions explained by staff, to include follow up appointments, RX's and safety plan. Patient reported mood 10/10.  Pt belongings returned to patient from locker #  23 intact. Patient escorted to lobby via staff for transport to destination. Safety maintained.

## 2022-12-17 NOTE — ED Notes (Signed)
Patient observed/assessed in room in bed appearing in no immediate distress resting peacefully. Q15 minute checks continued by MHT and nursing staff. Will continue to monitor and support. 

## 2022-12-17 NOTE — ED Notes (Signed)
Patient resting quietly in bed with eyes closed. Respirations equal and unlabored, skin warm and dry, NAD. No change in assessment or acuity. Q 15 minute safety checks remain in place.

## 2022-12-17 NOTE — ED Notes (Signed)
Patient A&Ox4. Patient denies SI/HI and  AVH. Patient denies any physical complaints when asked. No acute distress noted. Support and encouragement provided. Routine safety checks conducted according to facility protocol. Encouraged patient to notify staff if thoughts of harm toward self or others arise. Patient verbalize understanding and agreement. Will continue to monitor for safety.

## 2022-12-17 NOTE — ED Notes (Signed)
"  Pt denies SI, plan, and intention.  Suicide safety plan completed, reviewed with this RN, given to the patient, and a copy in the chart." 

## 2022-12-18 DIAGNOSIS — F1020 Alcohol dependence, uncomplicated: Secondary | ICD-10-CM | POA: Diagnosis not present

## 2022-12-18 DIAGNOSIS — F1420 Cocaine dependence, uncomplicated: Secondary | ICD-10-CM | POA: Diagnosis not present

## 2022-12-19 DIAGNOSIS — F1020 Alcohol dependence, uncomplicated: Secondary | ICD-10-CM | POA: Diagnosis not present

## 2022-12-19 DIAGNOSIS — F1420 Cocaine dependence, uncomplicated: Secondary | ICD-10-CM | POA: Diagnosis not present

## 2022-12-20 DIAGNOSIS — F1020 Alcohol dependence, uncomplicated: Secondary | ICD-10-CM | POA: Diagnosis not present

## 2022-12-20 DIAGNOSIS — F1420 Cocaine dependence, uncomplicated: Secondary | ICD-10-CM | POA: Diagnosis not present

## 2022-12-21 DIAGNOSIS — F1420 Cocaine dependence, uncomplicated: Secondary | ICD-10-CM | POA: Diagnosis not present

## 2022-12-21 DIAGNOSIS — F1020 Alcohol dependence, uncomplicated: Secondary | ICD-10-CM | POA: Diagnosis not present

## 2022-12-23 DIAGNOSIS — F1020 Alcohol dependence, uncomplicated: Secondary | ICD-10-CM | POA: Diagnosis not present

## 2022-12-23 DIAGNOSIS — F1420 Cocaine dependence, uncomplicated: Secondary | ICD-10-CM | POA: Diagnosis not present

## 2022-12-24 DIAGNOSIS — F1020 Alcohol dependence, uncomplicated: Secondary | ICD-10-CM | POA: Diagnosis not present

## 2022-12-24 DIAGNOSIS — F1420 Cocaine dependence, uncomplicated: Secondary | ICD-10-CM | POA: Diagnosis not present

## 2022-12-25 DIAGNOSIS — F1420 Cocaine dependence, uncomplicated: Secondary | ICD-10-CM | POA: Diagnosis not present

## 2022-12-25 DIAGNOSIS — F1020 Alcohol dependence, uncomplicated: Secondary | ICD-10-CM | POA: Diagnosis not present

## 2022-12-26 DIAGNOSIS — F1420 Cocaine dependence, uncomplicated: Secondary | ICD-10-CM | POA: Diagnosis not present

## 2022-12-26 DIAGNOSIS — F1020 Alcohol dependence, uncomplicated: Secondary | ICD-10-CM | POA: Diagnosis not present

## 2022-12-27 DIAGNOSIS — F1020 Alcohol dependence, uncomplicated: Secondary | ICD-10-CM | POA: Diagnosis not present

## 2022-12-27 DIAGNOSIS — F1420 Cocaine dependence, uncomplicated: Secondary | ICD-10-CM | POA: Diagnosis not present

## 2022-12-28 DIAGNOSIS — F1020 Alcohol dependence, uncomplicated: Secondary | ICD-10-CM | POA: Diagnosis not present

## 2022-12-28 DIAGNOSIS — F1420 Cocaine dependence, uncomplicated: Secondary | ICD-10-CM | POA: Diagnosis not present

## 2022-12-30 DIAGNOSIS — F1020 Alcohol dependence, uncomplicated: Secondary | ICD-10-CM | POA: Diagnosis not present

## 2022-12-30 DIAGNOSIS — F1420 Cocaine dependence, uncomplicated: Secondary | ICD-10-CM | POA: Diagnosis not present

## 2022-12-31 DIAGNOSIS — F1020 Alcohol dependence, uncomplicated: Secondary | ICD-10-CM | POA: Diagnosis not present

## 2022-12-31 DIAGNOSIS — F1420 Cocaine dependence, uncomplicated: Secondary | ICD-10-CM | POA: Diagnosis not present

## 2023-01-01 DIAGNOSIS — F1420 Cocaine dependence, uncomplicated: Secondary | ICD-10-CM | POA: Diagnosis not present

## 2023-01-01 DIAGNOSIS — F1020 Alcohol dependence, uncomplicated: Secondary | ICD-10-CM | POA: Diagnosis not present

## 2023-01-02 DIAGNOSIS — F1020 Alcohol dependence, uncomplicated: Secondary | ICD-10-CM | POA: Diagnosis not present

## 2023-01-02 DIAGNOSIS — F1420 Cocaine dependence, uncomplicated: Secondary | ICD-10-CM | POA: Diagnosis not present

## 2023-01-03 DIAGNOSIS — F1020 Alcohol dependence, uncomplicated: Secondary | ICD-10-CM | POA: Diagnosis not present

## 2023-01-03 DIAGNOSIS — F1420 Cocaine dependence, uncomplicated: Secondary | ICD-10-CM | POA: Diagnosis not present

## 2023-01-04 DIAGNOSIS — F1020 Alcohol dependence, uncomplicated: Secondary | ICD-10-CM | POA: Diagnosis not present

## 2023-01-04 DIAGNOSIS — F1420 Cocaine dependence, uncomplicated: Secondary | ICD-10-CM | POA: Diagnosis not present

## 2023-01-06 DIAGNOSIS — F1420 Cocaine dependence, uncomplicated: Secondary | ICD-10-CM | POA: Diagnosis not present

## 2023-01-06 DIAGNOSIS — F1020 Alcohol dependence, uncomplicated: Secondary | ICD-10-CM | POA: Diagnosis not present

## 2023-01-07 DIAGNOSIS — N200 Calculus of kidney: Secondary | ICD-10-CM | POA: Diagnosis not present

## 2023-01-07 DIAGNOSIS — N5089 Other specified disorders of the male genital organs: Secondary | ICD-10-CM | POA: Diagnosis not present

## 2023-01-07 DIAGNOSIS — K8020 Calculus of gallbladder without cholecystitis without obstruction: Secondary | ICD-10-CM | POA: Diagnosis not present

## 2023-01-07 DIAGNOSIS — Z8051 Family history of malignant neoplasm of kidney: Secondary | ICD-10-CM | POA: Diagnosis not present

## 2023-01-07 DIAGNOSIS — F1020 Alcohol dependence, uncomplicated: Secondary | ICD-10-CM | POA: Diagnosis not present

## 2023-01-07 DIAGNOSIS — N50812 Left testicular pain: Secondary | ICD-10-CM | POA: Diagnosis not present

## 2023-01-07 DIAGNOSIS — Z87442 Personal history of urinary calculi: Secondary | ICD-10-CM | POA: Diagnosis not present

## 2023-01-07 DIAGNOSIS — R1030 Lower abdominal pain, unspecified: Secondary | ICD-10-CM | POA: Diagnosis not present

## 2023-01-07 DIAGNOSIS — N2889 Other specified disorders of kidney and ureter: Secondary | ICD-10-CM | POA: Diagnosis not present

## 2023-01-07 DIAGNOSIS — N5082 Scrotal pain: Secondary | ICD-10-CM | POA: Diagnosis not present

## 2023-01-07 DIAGNOSIS — F1420 Cocaine dependence, uncomplicated: Secondary | ICD-10-CM | POA: Diagnosis not present

## 2023-01-08 DIAGNOSIS — F1020 Alcohol dependence, uncomplicated: Secondary | ICD-10-CM | POA: Diagnosis not present

## 2023-01-08 DIAGNOSIS — F1420 Cocaine dependence, uncomplicated: Secondary | ICD-10-CM | POA: Diagnosis not present

## 2023-01-09 DIAGNOSIS — F1020 Alcohol dependence, uncomplicated: Secondary | ICD-10-CM | POA: Diagnosis not present

## 2023-01-09 DIAGNOSIS — F1420 Cocaine dependence, uncomplicated: Secondary | ICD-10-CM | POA: Diagnosis not present

## 2023-01-10 DIAGNOSIS — F1020 Alcohol dependence, uncomplicated: Secondary | ICD-10-CM | POA: Diagnosis not present

## 2023-01-10 DIAGNOSIS — F1420 Cocaine dependence, uncomplicated: Secondary | ICD-10-CM | POA: Diagnosis not present

## 2023-01-11 DIAGNOSIS — F1020 Alcohol dependence, uncomplicated: Secondary | ICD-10-CM | POA: Diagnosis not present

## 2023-01-11 DIAGNOSIS — F1420 Cocaine dependence, uncomplicated: Secondary | ICD-10-CM | POA: Diagnosis not present

## 2023-01-13 DIAGNOSIS — F1420 Cocaine dependence, uncomplicated: Secondary | ICD-10-CM | POA: Diagnosis not present

## 2023-01-13 DIAGNOSIS — F1020 Alcohol dependence, uncomplicated: Secondary | ICD-10-CM | POA: Diagnosis not present

## 2023-01-14 DIAGNOSIS — F1020 Alcohol dependence, uncomplicated: Secondary | ICD-10-CM | POA: Diagnosis not present

## 2023-01-14 DIAGNOSIS — F1420 Cocaine dependence, uncomplicated: Secondary | ICD-10-CM | POA: Diagnosis not present

## 2023-01-15 DIAGNOSIS — J9811 Atelectasis: Secondary | ICD-10-CM | POA: Diagnosis not present

## 2023-01-15 DIAGNOSIS — F1420 Cocaine dependence, uncomplicated: Secondary | ICD-10-CM | POA: Diagnosis not present

## 2023-01-15 DIAGNOSIS — C649 Malignant neoplasm of unspecified kidney, except renal pelvis: Secondary | ICD-10-CM | POA: Diagnosis not present

## 2023-01-15 DIAGNOSIS — F1020 Alcohol dependence, uncomplicated: Secondary | ICD-10-CM | POA: Diagnosis not present

## 2023-01-15 DIAGNOSIS — D4100 Neoplasm of uncertain behavior of unspecified kidney: Secondary | ICD-10-CM | POA: Diagnosis not present

## 2023-01-16 DIAGNOSIS — F1020 Alcohol dependence, uncomplicated: Secondary | ICD-10-CM | POA: Diagnosis not present

## 2023-01-16 DIAGNOSIS — F1420 Cocaine dependence, uncomplicated: Secondary | ICD-10-CM | POA: Diagnosis not present

## 2023-01-17 DIAGNOSIS — F1020 Alcohol dependence, uncomplicated: Secondary | ICD-10-CM | POA: Diagnosis not present

## 2023-01-17 DIAGNOSIS — F1420 Cocaine dependence, uncomplicated: Secondary | ICD-10-CM | POA: Diagnosis not present

## 2023-01-18 DIAGNOSIS — F1420 Cocaine dependence, uncomplicated: Secondary | ICD-10-CM | POA: Diagnosis not present

## 2023-01-18 DIAGNOSIS — F1020 Alcohol dependence, uncomplicated: Secondary | ICD-10-CM | POA: Diagnosis not present

## 2023-01-20 DIAGNOSIS — F1020 Alcohol dependence, uncomplicated: Secondary | ICD-10-CM | POA: Diagnosis not present

## 2023-01-20 DIAGNOSIS — F1420 Cocaine dependence, uncomplicated: Secondary | ICD-10-CM | POA: Diagnosis not present

## 2023-01-21 DIAGNOSIS — F1420 Cocaine dependence, uncomplicated: Secondary | ICD-10-CM | POA: Diagnosis not present

## 2023-01-21 DIAGNOSIS — F1020 Alcohol dependence, uncomplicated: Secondary | ICD-10-CM | POA: Diagnosis not present

## 2023-01-22 DIAGNOSIS — F1020 Alcohol dependence, uncomplicated: Secondary | ICD-10-CM | POA: Diagnosis not present

## 2023-01-22 DIAGNOSIS — F1420 Cocaine dependence, uncomplicated: Secondary | ICD-10-CM | POA: Diagnosis not present

## 2023-01-23 DIAGNOSIS — F1020 Alcohol dependence, uncomplicated: Secondary | ICD-10-CM | POA: Diagnosis not present

## 2023-01-23 DIAGNOSIS — F1420 Cocaine dependence, uncomplicated: Secondary | ICD-10-CM | POA: Diagnosis not present

## 2023-01-24 DIAGNOSIS — F1420 Cocaine dependence, uncomplicated: Secondary | ICD-10-CM | POA: Diagnosis not present

## 2023-01-24 DIAGNOSIS — F1020 Alcohol dependence, uncomplicated: Secondary | ICD-10-CM | POA: Diagnosis not present

## 2023-01-25 DIAGNOSIS — F1020 Alcohol dependence, uncomplicated: Secondary | ICD-10-CM | POA: Diagnosis not present

## 2023-01-25 DIAGNOSIS — F1420 Cocaine dependence, uncomplicated: Secondary | ICD-10-CM | POA: Diagnosis not present

## 2023-01-27 DIAGNOSIS — F1420 Cocaine dependence, uncomplicated: Secondary | ICD-10-CM | POA: Diagnosis not present

## 2023-01-27 DIAGNOSIS — F1020 Alcohol dependence, uncomplicated: Secondary | ICD-10-CM | POA: Diagnosis not present

## 2023-03-07 DIAGNOSIS — M1A49X0 Other secondary chronic gout, multiple sites, without tophus (tophi): Secondary | ICD-10-CM | POA: Diagnosis not present

## 2023-03-07 DIAGNOSIS — K8020 Calculus of gallbladder without cholecystitis without obstruction: Secondary | ICD-10-CM | POA: Diagnosis not present

## 2023-03-07 DIAGNOSIS — Z6841 Body Mass Index (BMI) 40.0 and over, adult: Secondary | ICD-10-CM | POA: Diagnosis not present

## 2023-03-07 DIAGNOSIS — F3340 Major depressive disorder, recurrent, in remission, unspecified: Secondary | ICD-10-CM | POA: Diagnosis not present

## 2023-03-07 DIAGNOSIS — C641 Malignant neoplasm of right kidney, except renal pelvis: Secondary | ICD-10-CM | POA: Diagnosis not present

## 2023-03-07 DIAGNOSIS — K828 Other specified diseases of gallbladder: Secondary | ICD-10-CM | POA: Diagnosis not present

## 2023-03-07 DIAGNOSIS — Z79899 Other long term (current) drug therapy: Secondary | ICD-10-CM | POA: Diagnosis not present

## 2023-03-07 DIAGNOSIS — K8000 Calculus of gallbladder with acute cholecystitis without obstruction: Secondary | ICD-10-CM | POA: Diagnosis not present

## 2023-03-07 DIAGNOSIS — R300 Dysuria: Secondary | ICD-10-CM | POA: Diagnosis not present

## 2023-03-07 DIAGNOSIS — I288 Other diseases of pulmonary vessels: Secondary | ICD-10-CM | POA: Diagnosis not present

## 2023-03-07 DIAGNOSIS — Z5901 Sheltered homelessness: Secondary | ICD-10-CM | POA: Diagnosis not present

## 2023-03-07 DIAGNOSIS — R1031 Right lower quadrant pain: Secondary | ICD-10-CM | POA: Diagnosis not present

## 2023-03-07 DIAGNOSIS — M25512 Pain in left shoulder: Secondary | ICD-10-CM | POA: Diagnosis not present

## 2023-03-07 DIAGNOSIS — R079 Chest pain, unspecified: Secondary | ICD-10-CM | POA: Diagnosis not present

## 2023-03-07 DIAGNOSIS — Z720 Tobacco use: Secondary | ICD-10-CM | POA: Diagnosis not present

## 2023-03-07 DIAGNOSIS — I10 Essential (primary) hypertension: Secondary | ICD-10-CM | POA: Diagnosis not present

## 2023-03-07 DIAGNOSIS — N200 Calculus of kidney: Secondary | ICD-10-CM | POA: Diagnosis not present

## 2023-03-07 DIAGNOSIS — M545 Low back pain, unspecified: Secondary | ICD-10-CM | POA: Diagnosis not present

## 2023-03-07 DIAGNOSIS — K8010 Calculus of gallbladder with chronic cholecystitis without obstruction: Secondary | ICD-10-CM | POA: Diagnosis not present

## 2023-03-07 DIAGNOSIS — K5900 Constipation, unspecified: Secondary | ICD-10-CM | POA: Diagnosis not present

## 2023-03-07 DIAGNOSIS — I517 Cardiomegaly: Secondary | ICD-10-CM | POA: Diagnosis not present

## 2023-03-07 DIAGNOSIS — F4310 Post-traumatic stress disorder, unspecified: Secondary | ICD-10-CM | POA: Diagnosis not present

## 2023-03-07 DIAGNOSIS — G8918 Other acute postprocedural pain: Secondary | ICD-10-CM | POA: Diagnosis not present

## 2023-03-07 DIAGNOSIS — Z9049 Acquired absence of other specified parts of digestive tract: Secondary | ICD-10-CM | POA: Diagnosis not present

## 2023-03-07 DIAGNOSIS — M1A9XX0 Chronic gout, unspecified, without tophus (tophi): Secondary | ICD-10-CM | POA: Diagnosis not present

## 2023-03-07 DIAGNOSIS — E669 Obesity, unspecified: Secondary | ICD-10-CM | POA: Diagnosis not present

## 2023-03-07 DIAGNOSIS — K8021 Calculus of gallbladder without cholecystitis with obstruction: Secondary | ICD-10-CM | POA: Diagnosis not present

## 2023-03-07 DIAGNOSIS — Z87442 Personal history of urinary calculi: Secondary | ICD-10-CM | POA: Diagnosis not present

## 2023-03-07 DIAGNOSIS — R059 Cough, unspecified: Secondary | ICD-10-CM | POA: Diagnosis not present

## 2023-03-07 DIAGNOSIS — R0989 Other specified symptoms and signs involving the circulatory and respiratory systems: Secondary | ICD-10-CM | POA: Diagnosis not present

## 2023-03-07 DIAGNOSIS — R918 Other nonspecific abnormal finding of lung field: Secondary | ICD-10-CM | POA: Diagnosis not present

## 2023-03-07 DIAGNOSIS — R0789 Other chest pain: Secondary | ICD-10-CM | POA: Diagnosis not present

## 2023-03-07 DIAGNOSIS — R1011 Right upper quadrant pain: Secondary | ICD-10-CM | POA: Diagnosis not present

## 2023-03-07 DIAGNOSIS — Z7982 Long term (current) use of aspirin: Secondary | ICD-10-CM | POA: Diagnosis not present

## 2023-03-11 DIAGNOSIS — M545 Low back pain, unspecified: Secondary | ICD-10-CM | POA: Diagnosis not present

## 2023-03-11 DIAGNOSIS — R918 Other nonspecific abnormal finding of lung field: Secondary | ICD-10-CM | POA: Diagnosis not present

## 2023-03-11 DIAGNOSIS — I517 Cardiomegaly: Secondary | ICD-10-CM | POA: Diagnosis not present

## 2023-03-11 DIAGNOSIS — K5900 Constipation, unspecified: Secondary | ICD-10-CM | POA: Diagnosis not present

## 2023-03-11 DIAGNOSIS — R5383 Other fatigue: Secondary | ICD-10-CM | POA: Diagnosis not present

## 2023-03-11 DIAGNOSIS — Z9049 Acquired absence of other specified parts of digestive tract: Secondary | ICD-10-CM | POA: Diagnosis not present

## 2023-03-11 DIAGNOSIS — F17290 Nicotine dependence, other tobacco product, uncomplicated: Secondary | ICD-10-CM | POA: Diagnosis not present

## 2023-03-11 DIAGNOSIS — J9811 Atelectasis: Secondary | ICD-10-CM | POA: Diagnosis not present

## 2023-03-11 DIAGNOSIS — Z5901 Sheltered homelessness: Secondary | ICD-10-CM | POA: Diagnosis not present

## 2023-03-11 DIAGNOSIS — G8918 Other acute postprocedural pain: Secondary | ICD-10-CM | POA: Diagnosis not present

## 2023-03-11 DIAGNOSIS — R110 Nausea: Secondary | ICD-10-CM | POA: Diagnosis not present

## 2023-03-11 DIAGNOSIS — R300 Dysuria: Secondary | ICD-10-CM | POA: Diagnosis not present

## 2023-03-11 DIAGNOSIS — E669 Obesity, unspecified: Secondary | ICD-10-CM | POA: Diagnosis not present

## 2023-03-11 DIAGNOSIS — R079 Chest pain, unspecified: Secondary | ICD-10-CM | POA: Diagnosis not present

## 2023-03-11 DIAGNOSIS — R1031 Right lower quadrant pain: Secondary | ICD-10-CM | POA: Diagnosis not present

## 2023-03-11 DIAGNOSIS — Z79899 Other long term (current) drug therapy: Secondary | ICD-10-CM | POA: Diagnosis not present

## 2023-03-11 DIAGNOSIS — R0989 Other specified symptoms and signs involving the circulatory and respiratory systems: Secondary | ICD-10-CM | POA: Diagnosis not present

## 2023-03-11 DIAGNOSIS — I10 Essential (primary) hypertension: Secondary | ICD-10-CM | POA: Diagnosis not present

## 2023-03-11 DIAGNOSIS — Z7982 Long term (current) use of aspirin: Secondary | ICD-10-CM | POA: Diagnosis not present

## 2023-03-11 DIAGNOSIS — I288 Other diseases of pulmonary vessels: Secondary | ICD-10-CM | POA: Diagnosis not present

## 2023-03-11 DIAGNOSIS — M25512 Pain in left shoulder: Secondary | ICD-10-CM | POA: Diagnosis not present

## 2023-03-11 DIAGNOSIS — R059 Cough, unspecified: Secondary | ICD-10-CM | POA: Diagnosis not present

## 2023-03-11 DIAGNOSIS — R1011 Right upper quadrant pain: Secondary | ICD-10-CM | POA: Diagnosis not present

## 2023-03-11 DIAGNOSIS — R0789 Other chest pain: Secondary | ICD-10-CM | POA: Diagnosis not present

## 2023-03-12 DIAGNOSIS — R079 Chest pain, unspecified: Secondary | ICD-10-CM | POA: Diagnosis not present

## 2023-03-12 DIAGNOSIS — Z9049 Acquired absence of other specified parts of digestive tract: Secondary | ICD-10-CM | POA: Diagnosis not present

## 2023-03-12 DIAGNOSIS — R5383 Other fatigue: Secondary | ICD-10-CM | POA: Diagnosis not present

## 2023-03-12 DIAGNOSIS — G8918 Other acute postprocedural pain: Secondary | ICD-10-CM | POA: Diagnosis not present

## 2023-03-12 DIAGNOSIS — R1011 Right upper quadrant pain: Secondary | ICD-10-CM | POA: Diagnosis not present

## 2023-03-12 DIAGNOSIS — R059 Cough, unspecified: Secondary | ICD-10-CM | POA: Diagnosis not present

## 2023-03-12 DIAGNOSIS — F17290 Nicotine dependence, other tobacco product, uncomplicated: Secondary | ICD-10-CM | POA: Diagnosis not present

## 2023-03-12 DIAGNOSIS — R1031 Right lower quadrant pain: Secondary | ICD-10-CM | POA: Diagnosis not present

## 2023-03-12 DIAGNOSIS — K5900 Constipation, unspecified: Secondary | ICD-10-CM | POA: Diagnosis not present

## 2023-03-12 DIAGNOSIS — J9811 Atelectasis: Secondary | ICD-10-CM | POA: Diagnosis not present

## 2023-03-12 DIAGNOSIS — R110 Nausea: Secondary | ICD-10-CM | POA: Diagnosis not present

## 2023-03-15 DIAGNOSIS — F1120 Opioid dependence, uncomplicated: Secondary | ICD-10-CM | POA: Diagnosis not present

## 2023-03-16 DIAGNOSIS — F1120 Opioid dependence, uncomplicated: Secondary | ICD-10-CM | POA: Diagnosis not present

## 2023-03-16 DIAGNOSIS — Z9049 Acquired absence of other specified parts of digestive tract: Secondary | ICD-10-CM | POA: Diagnosis not present

## 2023-03-16 DIAGNOSIS — N200 Calculus of kidney: Secondary | ICD-10-CM | POA: Diagnosis not present

## 2023-03-16 DIAGNOSIS — K760 Fatty (change of) liver, not elsewhere classified: Secondary | ICD-10-CM | POA: Diagnosis not present

## 2023-03-16 DIAGNOSIS — R1011 Right upper quadrant pain: Secondary | ICD-10-CM | POA: Diagnosis not present

## 2023-03-16 DIAGNOSIS — G8918 Other acute postprocedural pain: Secondary | ICD-10-CM | POA: Diagnosis not present

## 2023-03-16 DIAGNOSIS — R079 Chest pain, unspecified: Secondary | ICD-10-CM | POA: Diagnosis not present

## 2023-03-17 DIAGNOSIS — F1120 Opioid dependence, uncomplicated: Secondary | ICD-10-CM | POA: Diagnosis not present

## 2023-03-18 DIAGNOSIS — F1420 Cocaine dependence, uncomplicated: Secondary | ICD-10-CM | POA: Diagnosis not present

## 2023-05-20 DIAGNOSIS — M109 Gout, unspecified: Secondary | ICD-10-CM | POA: Diagnosis not present

## 2023-05-20 DIAGNOSIS — I10 Essential (primary) hypertension: Secondary | ICD-10-CM | POA: Diagnosis not present

## 2023-05-20 DIAGNOSIS — L03113 Cellulitis of right upper limb: Secondary | ICD-10-CM | POA: Diagnosis not present

## 2023-05-20 DIAGNOSIS — E782 Mixed hyperlipidemia: Secondary | ICD-10-CM | POA: Diagnosis not present

## 2023-07-03 ENCOUNTER — Encounter (HOSPITAL_BASED_OUTPATIENT_CLINIC_OR_DEPARTMENT_OTHER): Payer: Self-pay | Admitting: Emergency Medicine

## 2023-07-03 ENCOUNTER — Other Ambulatory Visit: Payer: Self-pay

## 2023-07-03 ENCOUNTER — Encounter (HOSPITAL_COMMUNITY): Payer: Self-pay | Admitting: Family

## 2023-07-03 ENCOUNTER — Emergency Department (HOSPITAL_BASED_OUTPATIENT_CLINIC_OR_DEPARTMENT_OTHER)
Admission: EM | Admit: 2023-07-03 | Discharge: 2023-07-03 | Disposition: A | Discharge status: Other Acute Inpatient Hospital | Payer: BC Managed Care – PPO | Source: Home / Self Care | Attending: Emergency Medicine | Admitting: Emergency Medicine

## 2023-07-03 ENCOUNTER — Inpatient Hospital Stay (HOSPITAL_COMMUNITY)
Admission: AD | Admit: 2023-07-03 | Discharge: 2023-07-10 | DRG: 885 | Disposition: A | Discharge status: Home / Self Care | Payer: BC Managed Care – PPO | Source: Intra-hospital | Attending: Psychiatry | Admitting: Psychiatry

## 2023-07-03 DIAGNOSIS — I10 Essential (primary) hypertension: Secondary | ICD-10-CM | POA: Diagnosis not present

## 2023-07-03 DIAGNOSIS — Z79899 Other long term (current) drug therapy: Secondary | ICD-10-CM | POA: Insufficient documentation

## 2023-07-03 DIAGNOSIS — F99 Mental disorder, not otherwise specified: Secondary | ICD-10-CM | POA: Insufficient documentation

## 2023-07-03 DIAGNOSIS — F1510 Other stimulant abuse, uncomplicated: Secondary | ICD-10-CM | POA: Diagnosis present

## 2023-07-03 DIAGNOSIS — Z91018 Allergy to other foods: Secondary | ICD-10-CM

## 2023-07-03 DIAGNOSIS — M109 Gout, unspecified: Secondary | ICD-10-CM | POA: Diagnosis present

## 2023-07-03 DIAGNOSIS — R45851 Suicidal ideations: Secondary | ICD-10-CM | POA: Diagnosis not present

## 2023-07-03 DIAGNOSIS — F1091 Alcohol use, unspecified, in remission: Secondary | ICD-10-CM | POA: Diagnosis present

## 2023-07-03 DIAGNOSIS — F4310 Post-traumatic stress disorder, unspecified: Secondary | ICD-10-CM | POA: Insufficient documentation

## 2023-07-03 DIAGNOSIS — F909 Attention-deficit hyperactivity disorder, unspecified type: Secondary | ICD-10-CM | POA: Diagnosis present

## 2023-07-03 DIAGNOSIS — F17290 Nicotine dependence, other tobacco product, uncomplicated: Secondary | ICD-10-CM | POA: Diagnosis present

## 2023-07-03 DIAGNOSIS — Z9049 Acquired absence of other specified parts of digestive tract: Secondary | ICD-10-CM

## 2023-07-03 DIAGNOSIS — F411 Generalized anxiety disorder: Secondary | ICD-10-CM | POA: Diagnosis not present

## 2023-07-03 DIAGNOSIS — F332 Major depressive disorder, recurrent severe without psychotic features: Principal | ICD-10-CM | POA: Insufficient documentation

## 2023-07-03 DIAGNOSIS — F1910 Other psychoactive substance abuse, uncomplicated: Secondary | ICD-10-CM

## 2023-07-03 DIAGNOSIS — F1420 Cocaine dependence, uncomplicated: Secondary | ICD-10-CM | POA: Diagnosis not present

## 2023-07-03 DIAGNOSIS — Z716 Tobacco abuse counseling: Secondary | ICD-10-CM

## 2023-07-03 DIAGNOSIS — Z634 Disappearance and death of family member: Secondary | ICD-10-CM | POA: Diagnosis not present

## 2023-07-03 DIAGNOSIS — Z8269 Family history of other diseases of the musculoskeletal system and connective tissue: Secondary | ICD-10-CM

## 2023-07-03 DIAGNOSIS — G4700 Insomnia, unspecified: Secondary | ICD-10-CM | POA: Diagnosis present

## 2023-07-03 DIAGNOSIS — Z635 Disruption of family by separation and divorce: Secondary | ICD-10-CM

## 2023-07-03 DIAGNOSIS — Z87442 Personal history of urinary calculi: Secondary | ICD-10-CM

## 2023-07-03 DIAGNOSIS — F1994 Other psychoactive substance use, unspecified with psychoactive substance-induced mood disorder: Secondary | ICD-10-CM | POA: Diagnosis present

## 2023-07-03 DIAGNOSIS — F1210 Cannabis abuse, uncomplicated: Secondary | ICD-10-CM | POA: Diagnosis not present

## 2023-07-03 DIAGNOSIS — Z9089 Acquired absence of other organs: Secondary | ICD-10-CM

## 2023-07-03 DIAGNOSIS — Z87892 Personal history of anaphylaxis: Secondary | ICD-10-CM

## 2023-07-03 DIAGNOSIS — F32A Depression, unspecified: Secondary | ICD-10-CM | POA: Insufficient documentation

## 2023-07-03 DIAGNOSIS — F410 Panic disorder [episodic paroxysmal anxiety] without agoraphobia: Secondary | ICD-10-CM | POA: Diagnosis not present

## 2023-07-03 DIAGNOSIS — Z5689 Other problems related to employment: Secondary | ICD-10-CM

## 2023-07-03 DIAGNOSIS — M549 Dorsalgia, unspecified: Secondary | ICD-10-CM | POA: Diagnosis not present

## 2023-07-03 DIAGNOSIS — Z6841 Body Mass Index (BMI) 40.0 and over, adult: Secondary | ICD-10-CM

## 2023-07-03 DIAGNOSIS — Z8051 Family history of malignant neoplasm of kidney: Secondary | ICD-10-CM

## 2023-07-03 DIAGNOSIS — R634 Abnormal weight loss: Secondary | ICD-10-CM | POA: Diagnosis not present

## 2023-07-03 DIAGNOSIS — Z630 Problems in relationship with spouse or partner: Secondary | ICD-10-CM

## 2023-07-03 DIAGNOSIS — F1914 Other psychoactive substance abuse with psychoactive substance-induced mood disorder: Secondary | ICD-10-CM | POA: Insufficient documentation

## 2023-07-03 DIAGNOSIS — Z882 Allergy status to sulfonamides status: Secondary | ICD-10-CM

## 2023-07-03 DIAGNOSIS — C649 Malignant neoplasm of unspecified kidney, except renal pelvis: Secondary | ICD-10-CM | POA: Diagnosis present

## 2023-07-03 DIAGNOSIS — Z8249 Family history of ischemic heart disease and other diseases of the circulatory system: Secondary | ICD-10-CM

## 2023-07-03 DIAGNOSIS — Z87820 Personal history of traumatic brain injury: Secondary | ICD-10-CM

## 2023-07-03 DIAGNOSIS — Z5986 Financial insecurity: Secondary | ICD-10-CM

## 2023-07-03 DIAGNOSIS — E669 Obesity, unspecified: Secondary | ICD-10-CM | POA: Diagnosis present

## 2023-07-03 HISTORY — DX: Malignant neoplasm of unspecified kidney, except renal pelvis: C64.9

## 2023-07-03 LAB — COMPREHENSIVE METABOLIC PANEL
ALT: 12 U/L (ref 0–44)
AST: 15 U/L (ref 15–41)
Albumin: 4.5 g/dL (ref 3.5–5.0)
Alkaline Phosphatase: 112 U/L (ref 38–126)
Anion gap: 9 (ref 5–15)
BUN: 14 mg/dL (ref 6–20)
CO2: 28 mmol/L (ref 22–32)
Calcium: 10 mg/dL (ref 8.9–10.3)
Chloride: 101 mmol/L (ref 98–111)
Creatinine, Ser: 1.04 mg/dL (ref 0.61–1.24)
GFR, Estimated: >60 mL/min (ref >60)
Glucose, Bld: 113 mg/dL — ABNORMAL HIGH (ref 70–99)
Potassium: 3.5 mmol/L (ref 3.5–5.1)
Sodium: 138 mmol/L (ref 135–145)
Total Bilirubin: 1.6 mg/dL — ABNORMAL HIGH (ref 0.3–1.2)
Total Protein: 7.6 g/dL (ref 6.5–8.1)

## 2023-07-03 LAB — CBC WITH DIFFERENTIAL/PLATELET
Abs Immature Granulocytes: 0.03 10*3/uL (ref 0.00–0.07)
Basophils Absolute: 0 10*3/uL (ref 0.0–0.1)
Basophils Relative: 0 %
Eosinophils Absolute: 0 10*3/uL (ref 0.0–0.5)
Eosinophils Relative: 0 %
HCT: 39 % (ref 39.0–52.0)
Hemoglobin: 13.7 g/dL (ref 13.0–17.0)
Immature Granulocytes: 0 %
Lymphocytes Relative: 13 %
Lymphs Abs: 1.2 10*3/uL (ref 0.7–4.0)
MCH: 30.2 pg (ref 26.0–34.0)
MCHC: 35.1 g/dL (ref 30.0–36.0)
MCV: 85.9 fL (ref 80.0–100.0)
Monocytes Absolute: 0.7 10*3/uL (ref 0.1–1.0)
Monocytes Relative: 7 %
Neutro Abs: 7.8 10*3/uL — ABNORMAL HIGH (ref 1.7–7.7)
Neutrophils Relative %: 80 %
Platelets: 257 10*3/uL (ref 150–400)
RBC: 4.54 MIL/uL (ref 4.22–5.81)
RDW: 13.2 % (ref 11.5–15.5)
WBC: 9.8 10*3/uL (ref 4.0–10.5)
nRBC: 0 % (ref 0.0–0.2)

## 2023-07-03 LAB — ETHANOL: Alcohol, Ethyl (B): <10 mg/dL (ref <10)

## 2023-07-03 LAB — RAPID URINE DRUG SCREEN, HOSP PERFORMED
Amphetamines: POSITIVE — AB
Barbiturates: NOT DETECTED
Benzodiazepines: NOT DETECTED
Cocaine: POSITIVE — AB
Opiates: NOT DETECTED
Tetrahydrocannabinol: POSITIVE — AB

## 2023-07-03 MED ORDER — DIPHENHYDRAMINE HCL 25 MG PO CAPS: 50.0000 mg | ORAL_CAPSULE | Freq: Three times a day (TID) | ORAL | Status: DC | PRN

## 2023-07-03 MED ORDER — HALOPERIDOL 5 MG PO TABS: 5.0000 mg | ORAL_TABLET | Freq: Three times a day (TID) | ORAL | Status: DC | PRN

## 2023-07-03 MED ORDER — BUPROPION HCL ER (XL) 150 MG PO TB24
150.0000 mg | ORAL_TABLET | Freq: Every day | ORAL | Status: DC
  Administered 2023-07-03 – 2023-07-04 (×2): 150 mg via ORAL
  Filled 2023-07-03 (×4): qty 1

## 2023-07-03 MED ORDER — NICOTINE 14 MG/24HR TD PT24
14.0000 mg | MEDICATED_PATCH | Freq: Every day | TRANSDERMAL | Status: DC
  Administered 2023-07-03 – 2023-07-10 (×8): 14 mg via TRANSDERMAL
  Filled 2023-07-03 (×10): qty 1

## 2023-07-03 MED ORDER — DIPHENHYDRAMINE HCL 50 MG/ML IJ SOLN: 50.0000 mg | Freq: Three times a day (TID) | INTRAMUSCULAR | Status: DC | PRN

## 2023-07-03 MED ORDER — HALOPERIDOL LACTATE 5 MG/ML IJ SOLN: 5.0000 mg | Freq: Three times a day (TID) | INTRAMUSCULAR | Status: DC | PRN

## 2023-07-03 MED ORDER — LORAZEPAM 1 MG PO TABS: 2.0000 mg | ORAL_TABLET | Freq: Three times a day (TID) | ORAL | Status: DC | PRN

## 2023-07-03 MED ORDER — PANTOPRAZOLE SODIUM 20 MG PO TBEC
20.0000 mg | DELAYED_RELEASE_TABLET | Freq: Every day | ORAL | Status: DC
  Administered 2023-07-03 – 2023-07-10 (×8): 20 mg via ORAL
  Filled 2023-07-03 (×11): qty 1

## 2023-07-03 MED ORDER — MAGNESIUM HYDROXIDE 400 MG/5ML PO SUSP: 30.0000 mL | Freq: Every day | ORAL | Status: DC | PRN

## 2023-07-03 MED ORDER — TRAZODONE HCL 50 MG PO TABS
50.0000 mg | ORAL_TABLET | Freq: Every evening | ORAL | Status: DC | PRN
  Filled 2023-07-03: qty 1

## 2023-07-03 MED ORDER — ADULT MULTIVITAMIN W/MINERALS CH
1.0000 | ORAL_TABLET | Freq: Every day | ORAL | Status: DC
  Administered 2023-07-03 – 2023-07-10 (×8): 1 via ORAL
  Filled 2023-07-03 (×11): qty 1

## 2023-07-03 MED ORDER — ACETAMINOPHEN 325 MG PO TABS
650.0000 mg | ORAL_TABLET | Freq: Four times a day (QID) | ORAL | Status: DC | PRN
  Administered 2023-07-06 – 2023-07-07 (×2): 650 mg via ORAL
  Filled 2023-07-03 (×2): qty 2

## 2023-07-03 MED ORDER — NICOTINE 21 MG/24HR TD PT24
21.0000 mg | MEDICATED_PATCH | Freq: Once | TRANSDERMAL | Status: DC
  Filled 2023-07-03: qty 1

## 2023-07-03 MED ORDER — LORAZEPAM 2 MG/ML IJ SOLN: 2.0000 mg | Freq: Three times a day (TID) | INTRAMUSCULAR | Status: DC | PRN

## 2023-07-03 MED ORDER — HYDROXYZINE HCL 25 MG PO TABS
25.0000 mg | ORAL_TABLET | Freq: Three times a day (TID) | ORAL | Status: DC | PRN
  Administered 2023-07-05 – 2023-07-09 (×4): 25 mg via ORAL
  Filled 2023-07-03 (×5): qty 1

## 2023-07-03 MED ORDER — ALUM & MAG HYDROXIDE-SIMETH 200-200-20 MG/5ML PO SUSP: 30.0000 mL | ORAL | Status: DC | PRN
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
## 2023-07-03 NOTE — ED Notes (Signed)
TTS consult in progress at this time.

## 2023-07-03 NOTE — ED Provider Notes (Signed)
[place] EMERGENCY DEPARTMENT AT [facility] [place] Provider Note   CSN: [id number] Arrival date & time: 07/03/23  0630     History  No chief complaint on file.   Tyrone Barnett is a 52 y.o. male.  He is here with depression, vague suicidal ideation.  He said he had 15 months of sobriety but his father just died and he started using drugs again.  Wife kicked him out of the house.  Says he cannot see his kids.  Very distraught.  Denies any medical complaints.  The history is provided by the patient.  Mental Health Problem Presenting symptoms: depression and suicidal thoughts   Progression:  Unchanged Context: drug abuse and stressful life event   Treatment compliance:  Untreated Worsened by:  Family interactions and drugs Associated symptoms: no abdominal pain and no chest pain        Home Medications Prior to Admission medications   Medication Sig Start Date End Date Taking? Authorizing Provider  allopurinol (ZYLOPRIM) 100 MG tablet Take 1 tablet (100 mg total) by mouth daily. 12/17/22 01/16/23  Park Pope, MD  amLODipine-benazepril (LOTREL) 10-40 MG capsule Take 1 capsule by mouth daily. 12/16/22 01/15/23  Park Pope, MD  atenolol (TENORMIN) 100 MG tablet Take 1 tablet (100 mg total) by mouth daily. 12/16/22 01/15/23  Park Pope, MD  buPROPion (WELLBUTRIN XL) 150 MG 24 hr tablet Take 1 tablet (150 mg total) by mouth daily. 12/17/22 01/16/23  Park Pope, MD  FLUoxetine (PROZAC) 40 MG capsule Take 1 capsule (40 mg total) by mouth daily. 12/17/22 01/16/23  Park Pope, MD  gabapentin (NEURONTIN) 300 MG capsule Take 1 capsule (300 mg total) by mouth 2 (two) times daily. 12/16/22 01/15/23  Park Pope, MD  hydrOXYzine (ATARAX) 25 MG tablet Take 1 tablet (25 mg total) by mouth 3 (three) times daily as needed for anxiety (for anxiety). 12/16/22   Park Pope, MD  Multiple Vitamin (MULTIVITAMIN WITH MINERALS) TABS tablet Take 1 tablet by mouth daily. 12/17/22   Park Pope, MD  pantoprazole  (PROTONIX) 20 MG tablet Take 1 tablet (20 mg total) by mouth daily. 12/16/22   Park Pope, MD  traZODone (DESYREL) 50 MG tablet Take 1 tablet (50 mg total) by mouth at bedtime as needed for sleep (for insomnia). 12/16/22   Park Pope, MD      Allergies    Coconut fatty acids and Sulfa antibiotics    Review of Systems   Review of Systems  Constitutional:  Negative for fever.  HENT:  Negative for sore throat.   Respiratory:  Negative for shortness of breath.   Cardiovascular:  Negative for chest pain.  Gastrointestinal:  Negative for abdominal pain.  Genitourinary:  Negative for dysuria.  Skin:  Negative for rash.  Psychiatric/Behavioral:  Positive for suicidal ideas.     Physical Exam Updated Vital Signs BP (!) 143/93 (BP Location: Right Arm)   Pulse 80   Temp 98 F (36.7 C)   Resp 18   Ht 5\' 10"  (1.778 m)   Wt 127 kg   SpO2 96%   BMI 40.18 kg/m  Physical Exam Vitals and nursing note reviewed.  Constitutional:      General: He is not in acute distress.    Appearance: Normal appearance. He is well-developed.  HENT:     Head: Normocephalic and atraumatic.  Eyes:     Conjunctiva/sclera: Conjunctivae normal.  Cardiovascular:     Rate and Rhythm: Normal rate and regular rhythm.  Heart sounds: No murmur heard. Pulmonary:     Effort: Pulmonary effort is normal. No respiratory distress.     Breath sounds: Normal breath sounds.  Abdominal:     Palpations: Abdomen is soft.     Tenderness: There is no abdominal tenderness.  Musculoskeletal:        General: No deformity.     Cervical back: Neck supple.  Skin:    General: Skin is warm and dry.     Capillary Refill: Capillary refill takes less than 2 seconds.  Neurological:     General: No focal deficit present.     Mental Status: He is alert.     ED Results / Procedures / Treatments   Labs (all labs ordered are listed, but only abnormal results are displayed) Labs Reviewed  COMPREHENSIVE METABOLIC PANEL - Abnormal;  Notable for the following components:      Result Value   Glucose, Bld 113 (*)    Total Bilirubin 1.6 (*)    All other components within normal limits  RAPID URINE DRUG SCREEN, HOSP PERFORMED - Abnormal; Notable for the following components:   Cocaine POSITIVE (*)    Amphetamines POSITIVE (*)    Tetrahydrocannabinol POSITIVE (*)    All other components within normal limits  CBC WITH DIFFERENTIAL/PLATELET - Abnormal; Notable for the following components:   Neutro Abs 7.8 (*)    All other components within normal limits  ETHANOL    EKG EKG Interpretation Date/Time:  Thursday July 03 2023 07:43:20 EDT Ventricular Rate:  66 PR Interval:  200 QRS Duration:  100 QT Interval:  442 QTC Calculation: 463 R Axis:   35  Text Interpretation: Normal sinus rhythm Normal ECG When compared with ECG of 13-Dec-2022 15:12, No significant change since last tracing Confirmed by Meridee Score [phone]) on 07/03/2023 7:45:24 AM  Radiology No results found.  Procedures Procedures    Medications Ordered in ED Medications - No data to display  ED Course/ Medical Decision Making/ A&P Clinical Course as of 07/03/23 1734  Thu Jul 03, 2023  1228 Patient has been evaluated by psychiatry and they are recommending inpatient.  He has been accepted to [facility] under Dr. Vicente Masson.  Patient is voluntary and has signed in as such. [MB]    Clinical Course User Index [MB] Terrilee Files, MD                                 Medical Decision Making Amount and/or Complexity of Data Reviewed Labs: ordered.  Risk Decision regarding hospitalization.   This patient complains of substance use depression; this involves an extensive number of treatment Options and is a complaint that carries with it a high risk of complications and morbidity. The differential includes depression, substance abuse, stressors, intoxication  I ordered, reviewed and interpreted labs, which included CBC and chemistries  unremarkable, talk screen positive for cocaine and amphetamines THC I ordered medication nicotine patch as needed and reviewed PMP when indicated. Previous records obtained and reviewed in epic no recent admissions I consulted TTS and discussed lab and imaging findings and discussed disposition.  Cardiac monitoring reviewed, sinus rhythm Social determinants considered, tobacco and substance use Critical Interventions: None  After the interventions stated above, I reevaluated the patient and found patient to be hemodynamically stable Admission and further testing considered, behavioral health is recommending psychiatric admission.  Patient is voluntary and agreeable to this.  Final Clinical Impression(s) / ED Diagnoses Final diagnoses:  Suicidal ideation  Polysubstance abuse [facility])    Rx / DC Orders ED Discharge Orders     None         Terrilee Files, MD 07/03/23 1737

## 2023-07-03 NOTE — ED Triage Notes (Addendum)
Pt states "I don't want to be here any more" state is separated and can not see children, can not stay sober used cocaine last night. States Wife kicked him our couple hours ago, has no where to go.

## 2023-07-03 NOTE — ED Notes (Signed)
Voluntary form signed by pt.  Form faxed  to Behavioral health

## 2023-07-03 NOTE — BH Assessment (Signed)
Comprehensive Clinical Assessment (CCA) Note  07/03/2023 Tyrone Barnett [id number]  Disposition: Per Doran Heater NP, patient is recommended for inpatient treatment.   The patient demonstrates the following risk factors for suicide: Chronic risk factors for suicide include: psychiatric disorder of PTSD, anxiety, depression, substance use disorder, medical illness per pt reports he has cancer, and demographic factors (male, >51 y/o). Acute risk factors for suicide include: family or marital conflict, social withdrawal/isolation, and loss (financial, interpersonal, professional). Protective factors for this patient include:  none reported . Considering these factors, the overall suicide risk at this point appears to be moderate. Patient is not appropriate for outpatient follow up.  Tyrone Barnett is a 52 year old male presenting to [facility] voluntarily with chief complaint of depression and passive suicidal ideations for the past month. Patient reports multiple stressors including his wife kicking him out the house last night, he relapsed on cocaine last night and reports he was 11 months sober. Patient also reports that his father died about 5 years ago from cancer which he reports is the same cancer he is diagnosed with. Patient reports he cannot see his three children, his mother does not want anything to do with him and he can't keep a job. Patient states "life is not living anymore, and it would be better off if I wasn't fucking here". Patient denies active plans to kill himself however he cannot contract for safety.  Patient reports mental health diagnosis of PTSD, anxiety and depression. Patient reports depressive symptoms of isolating, anhedonia, crying, irritability, feeling helpless, hopeless, worthless, guilt feelings sleeping 2-3 hours a night, poor appetite and the loss of 56 pounds in the past couple of months.  Patient does not have outpatient services and denies history of inpatient treatment. Patient  reports that his PCP is prescribing him Prozac and Wellbutrin and he is compliant with his medications. Patient reports he has been on a "12-hour bender" smoking cocaine. Patient reports he used about a gram of cocaine today. Patient reports using delta 8 and his UDS is positive for amphetamines and THC. Patient reports he went to [facility] about 5 months ago because he was using prescription pills after he had his gallbladder removed and prior to that he was at [facility] and [employer] in [place] about 11 months ago.   Patient was living with his wife, his mother and three kids. 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 Patient denies legal issues. Patient does not have direct access to a firearm but reports he has one at home in the safe. Patient reports he has a hard time keeping employment and him and his wife have disagreements because he is never home due to his drug use. Patient family of origin consist of his mother who lives with his wife, his father who died 5 years ago and a younger brother that lives in the area, however patient reports he has not spoken to his brother in about two years. Patient reports he has isolated himself and have no other supports.      Patient is oriented x4, engaged, alert and cooperative. Patient eye contact is normal his speech is loud. Patient is tearful during assessment and at times agitated. Patient denies HI, AVH and reports more passive SI without plan however he can not contract for safety and present with several risk factors. Patient has never attempted suicide.    Chief Complaint:  Chief Complaint  Patient presents with   Suicidal  Visit Diagnosis: Substance induced mood disorder (HCC  Suicidal ideations     CCA Screening, Triage and Referral (STR)  Patient Reported Information How did you hear about [place]? Self  What Is the Reason for Your Visit/Call Today? Tyrone Barnett is a 52 y.o. male.  He is here with  depression, vague suicidal ideation.  He said he had 15 months of sobriety but his father just died and he started using drugs again.  Wife kicked him out of the house.  Says he cannot see his kids.  Very distraught.  Denies any medical complaints.  How Long Has This Been Causing You Problems? > than 6 months  What Do You Feel Would Help You the Most Today? Treatment for Depression or other mood problem; Alcohol or Drug Use Treatment   Have You Recently Had Any Thoughts About Hurting Yourself? Yes  Are You Planning to Commit Suicide/Harm Yourself At This time? No   Flowsheet Row ED from 07/03/2023 in [facility] Emergency Department at [facility] ED from 12/13/2022 in [facility] ED from 10/02/2022 in [facility] Emergency Department at [facility]  C-SSRS RISK CATEGORY High Risk Low Risk No Risk       Have you Recently Had Thoughts About Hurting Someone Karolee Ohs? No  Are You Planning to Harm Someone at This Time? No  Explanation: NA   Have You Used Any Alcohol or Drugs in the Past 24 Hours? Yes  What Did You Use and How Much? COCAINE   Do You Currently Have a Therapist/Psychiatrist? No  Name of Therapist/Psychiatrist: Name of Therapist/Psychiatrist: NA   Have You Been Recently Discharged From Any Office Practice or Programs? No  Explanation of Discharge From Practice/Program: NA     CCA Screening Triage Referral Assessment Type of Contact: Face-to-Face  Telemedicine Service Delivery:   Is this Initial or Reassessment?   Date Telepsych consult ordered in [facility]:    Time Telepsych consult ordered in [facility]:    Location of Assessment: [facility] Assessment Services  Provider Location: [facility] Assessment Services   Collateral Involvement: NONE   Does Patient Have a Automotive engineer Guardian? No  Legal Guardian Contact Information: NA  Copy of Legal Guardianship Form: -- (NA)  Legal Guardian Notified of Arrival: --  (NA)  Legal Guardian Notified of Pending Discharge: -- (NA)  If Minor and Not Living with Parent(s), Who has Custody? NA  Is CPS involved or ever been involved? Never  Is APS involved or ever been involved? Never   Patient Determined To Be At Risk for Harm To Self or Others Based on Review of Patient Reported Information or Presenting Complaint? Yes, for Self-Harm  Method: No Plan  Availability of Means: No access or NA  Intent: Vague intent or NA  Notification Required: No need or identified person  Additional Information for Danger to Others Potential: -- (NA)  Additional Comments for Danger to Others Potential: NA  Are There Guns or Other Weapons in Your Home? Yes  Types of Guns/Weapons: GUNS  Are These Weapons Safely Secured?                            Yes  Who Could Verify You Are Able To Have These Secured: WIFE  Do You Have any Outstanding Charges, Pending Court Dates, Parole/Probation? DENIES  Contacted To Inform of Risk of Harm To Self or Others: Unable to Contact:  Does Patient Present under Involuntary Commitment? No    [place] of Residence: [place]   Patient Currently Receiving the Following Services: Not Receiving Services   Determination of Need: Urgent (48 hours)   Options For Referral: Inpatient Hospitalization; Medication Management; Outpatient Therapy     CCA Biopsychosocial Patient Reported Schizophrenia/Schizoaffective Diagnosis in Past: No   Strengths: Significant past sobriety; wife and children as motivation   Mental Health Symptoms Depression:   Change in energy/activity; Difficulty Concentrating; Fatigue; Hopelessness; Increase/decrease in appetite; Irritability; Tearfulness; Worthlessness; Sleep (too much or little); Weight gain/loss   Duration of Depressive symptoms:  Duration of Depressive Symptoms: Greater than two weeks   Mania:   N/A   Anxiety:    Difficulty concentrating; Irritability; Restlessness; Sleep;  Tension; Worrying; Fatigue   Psychosis:   None   Duration of Psychotic symptoms:    Trauma:   Avoids reminders of event; Detachment from others; Emotional numbing; Guilt/shame; Hypervigilance; Irritability/anger   Obsessions:   Absent   Compulsions:   N/A   Inattention:   N/A   Hyperactivity/Impulsivity:   N/A   Oppositional/Defiant Behaviors:   N/A   Emotional Irregularity:   Intense/unstable relationships; Mood lability   Other Mood/Personality Symptoms:  NA   Mental Status Exam Appearance and self-care  Stature:   Average   Weight:   Overweight   Clothing:   Casual; Disheveled   Grooming:   Neglected   Cosmetic use:   None   Posture/gait:   Tense   Motor activity:   Restless   Sensorium  Attention:   Normal   Concentration:   Normal   Orientation:   X5   Recall/memory:   Normal   Affect and Mood  Affect:   Anxious; Depressed   Mood:   Anxious; Depressed   Relating  Eye contact:   Normal   Facial expression:   Depressed; Responsive; Tense   Attitude toward examiner:   Cooperative   Thought and Language  Speech flow:  Clear and Coherent   Thought content:   Appropriate to Mood and Circumstances   Preoccupation:   None   Hallucinations:   None   Organization:   Coherent   [employer] of Knowledge:   Good   Intelligence:   Average   Abstraction:   Normal   Judgement:   Poor   Reality Testing:   Adequate   Insight:   Gaps   Decision Making:   Vacilates   Social Functioning  Social Maturity:   Irresponsible; Isolates   Social Judgement:   Normal   Stress  Stressors:   Family conflict; Relationship; Housing; Surveyor, quantity; Illness; Grief/losses   Coping Ability:   Exhausted; Deficient supports   Skill Deficits:   Interpersonal; Responsibility   Supports:   Family; Friends/Service system; Support needed; Other (Comment) (12 step programs)      Religion: Religion/Spirituality Are You A Religious Person?: Yes What is Your Religious Affiliation?: Christian How Might This Affect Treatment?: NA  Leisure/Recreation: Leisure / Recreation Do You Have Hobbies?: No Leisure and Hobbies: NA  Exercise/Diet: Exercise/Diet Do You Exercise?: No Have You Gained or Lost A Significant Amount of Weight in the Past Six Months?: Yes-Lost Number of Pounds Lost?: 56 Do You Follow a Special Diet?: No Do You Have Any Trouble Sleeping?: Yes Explanation of Sleeping Difficulties: 2-3 HOURS A NIGHT   CCA Employment/Education Employment/Work Situation: Employment / Work Situation Employment Situation: Employed Work Stressors: WORKING AS A LIFT DRIVER Patient's Job has Been Impacted  by Current Illness: No Describe how Patient's Job has Been Impacted: NA Has Patient ever Been in the Military?: No  Education: Education Is Patient Currently Attending School?: No Last Grade Completed: 12 Did You Attend College?: Yes What Type of College Degree Do you Have?: BA IN BUSINESS Did You Have An Individualized Education Program (IIEP): No Did You Have Any Difficulty At School?: No Patient's Education Has Been Impacted by Current Illness: No   CCA Family/Childhood History Family and Relationship History: Family history Marital status: Married Number of Years Married:  (UNKNOWN) What types of issues is patient dealing with in the relationship?: PT DRUG USE HAS CASUED ISSUES IN HIS MARRIAGE Additional relationship information: NA Does patient have children?: Yes How many children?: 3 How is patient's relationship with their children?: NOT GOOD DUE TO DRUG USE  Childhood History:  Childhood History By whom was/is the patient raised?: Both parents Did patient suffer any verbal/emotional/physical/sexual abuse as a child?: No (n/a) Did patient suffer from severe childhood neglect?: No Has patient ever been sexually abused/assaulted/raped as an  adolescent or adult?: No (n/a) Was the patient ever a victim of a crime or a disaster?: No Patient description of being a victim of a crime or disaster: NA Witnessed domestic violence?: No (n/a) Has patient been affected by domestic violence as an adult?: No (n/a)       CCA Substance Use Alcohol/Drug Use: Alcohol / Drug Use Pain Medications: denies Prescriptions: Prozac, Gabapentin & BP meds- none taken in past 5 days Over the Counter: n/a History of alcohol / drug use?: Yes Longest period of sobriety (when/how long): 15 YEARS. PT REPORTS HE WAS SOBER FOR 11 MONTHS PRIOR TO LAST NIGHT Negative Consequences of Use: Financial, Personal relationships, Work / School Withdrawal Symptoms: Patient aware of relationship between substance abuse and physical/medical complications Substance #1 Name of Substance 1: COCAINE 1 - Age of First Use: 20'S 1 - Amount (size/oz): GRAM 1 - Frequency: RELAPSED LAST NIGHT/WAS SOBER FOR 11 MONTHS 1 - Duration: ONGOING 1 - Last Use / Amount: 12 HOUR "BENDER" 1 - Method of Aquiring: UNKNOWN 1- Route of Use: SMOKING                       ASAM's:  Six Dimensions of Multidimensional Assessment  Dimension 1:  Acute Intoxication and/or Withdrawal Potential:      Dimension 2:  Biomedical Conditions and Complications:      Dimension 3:  Emotional, Behavioral, or Cognitive Conditions and Complications:     Dimension 4:  Readiness to Change:     Dimension 5:  Relapse, Continued use, or Continued Problem Potential:     Dimension 6:  Recovery/Living Environment:     ASAM Severity Score:    ASAM Recommended Level of Treatment: ASAM Recommended Level of Treatment: Level I Outpatient Treatment   Substance use Disorder (SUD) Substance Use Disorder (SUD)  Checklist Symptoms of Substance Use: Persistent desire or unsuccessful efforts to cut down or control use  Recommendations for Services/Supports/Treatments: Recommendations for  Services/Supports/Treatments Recommendations For Services/Supports/Treatments: Inpatient Hospitalization  Discharge Disposition: Discharge Disposition Medical Exam completed: Yes Disposition of Patient: Admit  DSM5 Diagnoses: Patient Active Problem List   Diagnosis Date Noted   Substance induced mood disorder (HCC) 12/13/2022   Nephrolithiasis 02/15/2022   Severe cocaine use disorder (HCC) 01/28/2021   Cocaine abuse with cocaine-induced mood disorder (HCC) 01/28/2021   Hypertension 05/30/2011   Obesity 05/30/2011   Supraventricular tachycardia 05/30/2011  Referrals to Alternative Service(s): Referred to Alternative Service(s):   Place:   Date:   Time:    Referred to Alternative Service(s):   Place:   Date:   Time:    Referred to Alternative Service(s):   Place:   Date:   Time:    Referred to Alternative Service(s):   Place:   Date:   Time:     Audree Camel, [facility]

## 2023-07-03 NOTE — Group Note (Signed)
Date:  07/03/2023 Time:  9:10 PM  Group Topic/Focus:  Wrap-Up Group:   The focus of this group is to help patients review their daily goal of treatment and discuss progress on daily workbooks.    Participation Level:  Minimal  Participation Quality:  Appropriate and Sharing  Affect:  Appropriate  Cognitive:  Appropriate  Insight: Appropriate and Limited  Engagement in Group:  Engaged and Limited  Modes of Intervention:  Discussion and Socialization  Additional Comments:  The patient stated that is was his first day on the unit. The patient stated that he didn't have a goal for today and does not have a goal for tomorrow. The patient stated that he just wanted to make it through and get through each day. The group started off with what is your favorite sport/hobby and the patient stated that his favorite sport/hobby is cooking and that he owes a [employer] food truck.   Kennieth Francois 07/03/2023, 9:10 PM

## 2023-07-03 NOTE — Tx Team (Signed)
Initial Treatment Plan 07/03/2023 3:10 PM Tyrone Barnett ION:[id number]    PATIENT STRESSORS: Financial difficulties   Marital or family conflict   Occupational concerns   Substance abuse     PATIENT STRENGTHS: Average or above average intelligence    PATIENT IDENTIFIED PROBLEMS:   Suicidal Ideation "I don't want to be here anymore"    Substance Abuse (Cocaine)    Conflict with wife; kicked out of house due to conflict and unable to see children    Financial and occupational stress       DISCHARGE CRITERIA:  Adequate post-discharge living arrangements Improved stabilization in mood, thinking, and/or behavior  PRELIMINARY DISCHARGE PLAN: Outpatient therapy Participate in family therapy  PATIENT/FAMILY INVOLVEMENT: This treatment plan has been presented to and reviewed with the patient, Tyrone Barnett. The patient has been given the opportunity to ask questions and make suggestions.  Earma Reading Ashyia Schraeder, RN 07/03/2023, 3:10 PM

## 2023-07-03 NOTE — Plan of Care (Signed)
  Problem: Education: Goal: Knowledge of [facility] Education information/materials will improve Outcome: Progressing Goal: Verbalization of understanding the information provided will improve Outcome: Progressing   

## 2023-07-03 NOTE — ED Notes (Signed)
Safe transport to [facility]. Arrived safely. Taking out of Epic.

## 2023-07-03 NOTE — Progress Notes (Signed)
   07/03/23 2000  Psych Admission Type (Psych Patients Only)  Admission Status Voluntary  Psychosocial Assessment  Patient Complaints Anxiety;Depression;Irritability  Eye Contact Fair  Facial Expression Sad;Flat  Affect Irritable;Flat  Speech Argumentative  Interaction Assertive  Motor Activity Other (Comment) (WDL)  Appearance/Hygiene Unremarkable  Behavior Characteristics Cooperative;Irritable  Mood Depressed;Irritable  Thought Process  Coherency WDL  Content WDL  Delusions WDL  Perception WDL  Hallucination None reported or observed  Judgment Impaired  Confusion None  Danger to Self  Current suicidal ideation? Passive  Agreement Not to Harm Self Yes  Description of Agreement verbal  Danger to Others  Danger to Others None reported or observed

## 2023-07-03 NOTE — ED Notes (Signed)
Order for sitter placed. Sitter requested thru staffing.

## 2023-07-03 NOTE — Progress Notes (Signed)
Pt has been accepted to [facility] TODAY 07/03/2023. Bed assignment: 404-1  Pt meets inpatient criteria per Doran Heater, NP  Attending Physician will be Phineas Inches, MD  Report can be called to: - Adult unit: [phone]  Pt can arrive after 1:30 PM  Care Team Notified: [facility], RN, Doran Heater, NP, Meridee Score, MD, and Candy [place], RN  [place], [place]  07/03/2023 12:47 PM

## 2023-07-03 NOTE — ED Notes (Signed)
Called safe transport for arranging pt transport to [facility]. Pt changed into scrubs for transport.

## 2023-07-03 NOTE — ED Notes (Signed)
Safe transport arrived to transport pt to [facility].  Pt escorted to vehicle.

## 2023-07-03 NOTE — ED Notes (Signed)
Pt refused socks being offered.

## 2023-07-04 ENCOUNTER — Encounter (HOSPITAL_COMMUNITY): Payer: Self-pay

## 2023-07-04 DIAGNOSIS — F411 Generalized anxiety disorder: Secondary | ICD-10-CM | POA: Insufficient documentation

## 2023-07-04 DIAGNOSIS — F332 Major depressive disorder, recurrent severe without psychotic features: Secondary | ICD-10-CM | POA: Diagnosis not present

## 2023-07-04 DIAGNOSIS — F4310 Post-traumatic stress disorder, unspecified: Secondary | ICD-10-CM | POA: Insufficient documentation

## 2023-07-04 MED ORDER — VENLAFAXINE HCL ER 37.5 MG PO CP24
37.5000 mg | ORAL_CAPSULE | Freq: Every day | ORAL | Status: AC
  Administered 2023-07-05: 37.5 mg via ORAL
  Filled 2023-07-04: qty 1

## 2023-07-04 MED ORDER — ALLOPURINOL 300 MG PO TABS
300.0000 mg | ORAL_TABLET | Freq: Every day | ORAL | Status: DC
  Administered 2023-07-04 – 2023-07-10 (×7): 300 mg via ORAL
  Filled 2023-07-04 (×9): qty 1

## 2023-07-04 MED ORDER — BENAZEPRIL HCL 40 MG PO TABS
40.0000 mg | ORAL_TABLET | Freq: Every day | ORAL | Status: DC
  Administered 2023-07-07 – 2023-07-10 (×4): 40 mg via ORAL
  Filled 2023-07-04 (×10): qty 1

## 2023-07-04 MED ORDER — OXYCODONE HCL 5 MG PO TABS
5.0000 mg | ORAL_TABLET | Freq: Four times a day (QID) | ORAL | Status: DC | PRN
  Administered 2023-07-04 – 2023-07-09 (×12): 5 mg via ORAL
  Filled 2023-07-04 (×12): qty 1

## 2023-07-04 MED ORDER — AMLODIPINE BESYLATE 10 MG PO TABS
10.0000 mg | ORAL_TABLET | Freq: Every day | ORAL | Status: DC
  Administered 2023-07-04 – 2023-07-10 (×7): 10 mg via ORAL
  Filled 2023-07-04 (×9): qty 1

## 2023-07-04 MED ORDER — ATENOLOL 100 MG PO TABS
100.0000 mg | ORAL_TABLET | Freq: Every day | ORAL | Status: DC
  Administered 2023-07-04 – 2023-07-10 (×7): 100 mg via ORAL
  Filled 2023-07-04 (×9): qty 1

## 2023-07-04 MED ORDER — VENLAFAXINE HCL ER 75 MG PO CP24
75.0000 mg | ORAL_CAPSULE | Freq: Every day | ORAL | Status: DC
  Administered 2023-07-06 – 2023-07-07 (×2): 75 mg via ORAL
  Filled 2023-07-04 (×4): qty 1

## 2023-07-04 NOTE — Plan of Care (Signed)
  Problem: Safety: Goal: Ability to disclose and discuss suicidal ideas will improve Outcome: Progressing Goal: Ability to identify and utilize support systems that promote safety will improve Outcome: Progressing   Problem: Medication: Goal: Compliance with prescribed medication regimen will improve Outcome: Progressing   Problem: Self-Concept: Goal: Ability to disclose and discuss suicidal ideas will improve Outcome: Progressing Goal: Will verbalize positive feelings about self Outcome: Progressing

## 2023-07-04 NOTE — Plan of Care (Signed)
  Problem: Education: Goal: Knowledge of [facility] Education information/materials will improve Outcome: Progressing Goal: Emotional status will improve Outcome: Progressing Goal: Mental status will improve Outcome: Progressing Goal: Verbalization of understanding the information provided will improve Outcome: Progressing   

## 2023-07-04 NOTE — [facility] Suicide Risk Assessment (Signed)
[facility] Admission Suicide Risk Assessment   Nursing information obtained from:  Patient Demographic factors:  Male, Unemployed Current Mental Status:  Suicidal ideation indicated by patient Loss Factors:  Loss of significant relationship Historical Factors:  Impulsivity Risk Reduction Factors:  Responsible for children under 51 years of age  Total Time spent with patient: 45 minutes Principal Problem: MDD (major depressive disorder), recurrent severe, without psychosis (HCC) Diagnosis:  Principal Problem:   MDD (major depressive disorder), recurrent severe, without psychosis (HCC) Active Problems:   Hypertension   Severe cocaine use disorder (HCC)   GAD (generalized anxiety disorder)   PTSD (post-traumatic stress disorder)  Subjective Data: See H&P   Continued Clinical Symptoms:  Alcohol Use Disorder Identification Test Final Score (AUDIT): 0 The "Alcohol Use Disorders Identification Test", Guidelines for Use in Primary Care, Second Edition.  World Science writer [facility]). Score between 0-7:  no or low risk or alcohol related problems. Score between 8-15:  moderate risk of alcohol related problems. Score between 16-19:  high risk of alcohol related problems. Score 20 or above:  warrants further diagnostic evaluation for alcohol dependence and treatment.   CLINICAL FACTORS:   Severe Anxiety and/or Agitation Panic Attacks Depression:   Anhedonia Hopelessness Impulsivity Insomnia Recent sense of peace/wellbeing Severe Alcohol/Substance Abuse/Dependencies More than one psychiatric diagnosis Unstable or Poor Therapeutic Relationship Previous Psychiatric Diagnoses and Treatments   Musculoskeletal: Strength & Muscle Tone: within normal limits Gait & Station: normal Patient leans: N/A  Psychiatric Specialty Exam:  Presentation  General Appearance:  Disheveled  Eye Contact: Fair  Speech: Slow  Speech Volume: Decreased  Handedness: Right   Mood and Affect   Mood: Anxious; Depressed  Affect: Tearful; Congruent; Depressed   Thought Process  Thought Processes: Linear  Descriptions of Associations:Intact  Orientation:Full (Time, Place and Person)  Thought Content:Logical  History of Schizophrenia/Schizoaffective disorder:No  Duration of Psychotic Symptoms:No data recorded Hallucinations:Hallucinations: None  Ideas of Reference:None  Suicidal Thoughts:Suicidal Thoughts: Yes, Passive SI Passive Intent and/or Plan: Without Intent; Without Plan  Homicidal Thoughts:Homicidal Thoughts: No   Sensorium  Memory: Immediate Fair; Recent Fair; Remote Fair  Judgment: Impaired  Insight: Lacking   Executive Functions  Concentration: Poor  Attention Span: Poor  Recall: Good  Fund of Knowledge: Good  Language: Good   Psychomotor Activity  Psychomotor Activity: Psychomotor Activity: Normal   Assets  Assets: Communication Skills; Desire for Improvement; Financial Resources/Insurance; Resilience; Social Support   Sleep  Sleep: Sleep: Poor    Physical Exam: Physical Exam Vitals reviewed.  Constitutional:      General: He is not in acute distress.    Appearance: He is not toxic-appearing.  Pulmonary:     Effort: Pulmonary effort is normal. No respiratory distress.  Neurological:     Mental Status: He is alert.     Motor: No weakness.     Gait: Gait normal.  Psychiatric:        Behavior: Behavior normal.    Review of Systems  Constitutional:  Negative for chills and fever.  Cardiovascular:  Negative for chest pain and palpitations.  Neurological:  Negative for dizziness, tingling, tremors and headaches.  Psychiatric/Behavioral:  Positive for depression, substance abuse and suicidal ideas. Negative for hallucinations and memory loss. The patient is nervous/anxious and has insomnia.   All other systems reviewed and are negative.  Blood pressure (!) 125/92, pulse 70, temperature 98 F (36.7 C),  temperature source Oral, resp. rate 20, height 5\' 10"  (1.778 m), weight 129.3 kg, SpO2 100%. Body mass  index is 40.89 kg/m.   COGNITIVE FEATURES THAT CONTRIBUTE TO RISK:  None    SUICIDE RISK:   Severe:  Frequent, intense, and enduring suicidal ideation, specific plan, no subjective intent, but some objective markers of intent (i.e., choice of lethal method), the method is accessible, some limited preparatory behavior, evidence of impaired self-control, severe dysphoria/symptomatology, multiple risk factors present, and few if any protective factors, particularly a lack of social support.  PLAN OF CARE: See H&P   I certify that inpatient services furnished can reasonably be expected to improve the patient's condition.   Cristy Hilts, MD 07/04/2023, 3:02 PM

## 2023-07-04 NOTE — Progress Notes (Addendum)
Pt denied SI/HI/AVH this morning. Pt is requesting to have home medications restarted, MD notified. Pt presents with irritable affect but has remained cooperative throughout the day. Pt given scheduled medications as prescribed. Q15 min checks verified for safety. Patient verbally contracts for safety. Patient compliant with medications and treatment plan. Patient is interacting well on the unit. Pt is safe on the unit.   07/04/23 0800  Psych Admission Type (Psych Patients Only)  Admission Status Voluntary  Psychosocial Assessment  Patient Complaints Anxiety;Depression  Eye Contact Fair  Facial Expression Sad;Flat  Affect Irritable;Flat  Speech Logical/coherent  Interaction Assertive  Motor Activity Other (Comment) (WDL)  Appearance/Hygiene Unremarkable  Behavior Characteristics Cooperative;Irritable  Mood Depressed;Anxious  Thought Process  Coherency WDL  Content Preoccupation (Preoccupied with having home medications restarted)  Delusions WDL  Perception WDL  Hallucination None reported or observed  Judgment Impaired  Confusion None  Danger to Self  Current suicidal ideation? Denies ("Not right now")  Self-Injurious Behavior No self-injurious ideation or behavior indicators observed or expressed   Agreement Not to Harm Self Yes  Description of Agreement Pt verbally contracts for safety  Danger to Others  Danger to Others None reported or observed

## 2023-07-04 NOTE — H&P (Signed)
Psychiatric Admission Assessment Adult  Patient Identification: Tyrone Barnett MRN:  [id number] Date of Evaluation:  07/04/2023 Chief Complaint:  Substance induced mood disorder (HCC) [F19.94] Principal Diagnosis: MDD (major depressive disorder), recurrent severe, without psychosis (HCC) Diagnosis:  Principal Problem:   MDD (major depressive disorder), recurrent severe, without psychosis (HCC) Active Problems:   Hypertension   Severe cocaine use disorder (HCC)   GAD (generalized anxiety disorder)   PTSD (post-traumatic stress disorder)  History of Present Illness:  Patient is a 52 year old male with a psychiatric history of MDD, GAD, ADHD and PTSD, who was admitted to the psychiatric unit for evaluation and treatment of worsening depression and suicidal thoughts.  Prior to admission psychiatric medications: Prozac, Wellbutrin, Strattera Patient reports 7 out of 7-day adherence with psychiatric medications leading up to this admission.  He reports his medications are only minimally effective for the treatment of his symptoms.  He is also not currently in psychotherapy  On my evaluation today, the patient reports severe depression for at least 2 years.  He reports depression has worsened acutely over the last few months, and also has very severe suicidal thoughts, they are mostly described as passive "my kids do not need me.  I am at peace with that.  I am worth more dead than alive.  I should not be here.  I have cancer and if I die tomorrow that is better for everyone.  I have no reason to get better and I just want to die".  Patient did clarify he does not have any suicidal intent or plan at the time of my evaluation.  Patient reports multiple psychosocial stressors causing his overall psychiatric decline including diagnosis of renal cell carcinoma in January 2024 (father had renal cell carcinoma as well, and the patient cared for the father until the father passed away from renal cell carcinoma),  marital conflict, financial difficulties, difficulty maintaining stable employment, and relapse on cocaine and other drugs.  Patient reports that his mood is down depressed and sad.  He reports feeling hopeless helpless and worthless.  He reports that he is at peace in regards to his suicidal thoughts.  He reports that insomnia is poor with difficulty maintaining sleep.  Reports appetite is up and down but does report a 59 pound weight loss over the last few months, which could be due to multiple factors including gallbladder surgery, depressive symptoms, feeling overwhelmed and stressed and anxious, and possibly cancer. Patient reports that his concentration is poor and this is because he reports he has not had ADHD since he was a kid.  Reports low energy.  Reports anhedonia.  Reports having passive suicidal thoughts now, they are severe even though they are not active suicidal thoughts.  He would much rather be dead than ever be discharged from the hospital or return home.  Denies any HI.  Reports that anxiety is excessive, acute on chronic and generalized.  Reports having panic attacks almost daily.  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  Of note, the patient reports he was supposed to have a nephrectomy, but decided to not have this surgery because no one would help him recover and no one  cared about him.  Patient reports symptoms meeting criteria for PTSD, including intrusive memories, hypervigilance, avoidance symptoms, and negative alterations in cognition and mood.  Denies any psychotic symptoms.  Denies any symptoms meeting criteria for bipolar disorder at this time or in the past.   Past psychiatric  history: MDD, GAD, PTSD, ADHD Patient denies any history of psychiatric hospitalization Patient denies any history of suicide attempts Current psychiatric medications: Prozac Wellbutrin and Strattera Past psychiatric medication trials: Patient reports he has been on multiple psychiatric medications past but does not recall any of the specific names of these medications or if they were effective or not  Past medical history: Renal cell carcinoma, diagnosed in January 2024.  No current treatment, was said to have a nephrectomy because of his surgery.  He watched his father die from renal cell carcinoma. 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  Reports he is divorced from his first light, separated from his current wife, has 3 children, 5, 5, and 10  Substance use: Patient reports that cocaine is drug of choice she is in any form including IV.  Reports use for many years, but did have a period sobriety about 15 years, that ended about 5 years ago when his father passed away.  Also reports abusing marijuana and delta A.  Patient reports a history of more heavy alcohol use but reports he has been sober for many years.  Reports a history of opiate overuse, but he went to rehab for this Patient reports active nicotine use via vaping    Total Time spent with patient: 45 minutes    Is the patient at risk to self? Yes.    Has the patient been a risk to self in the past 6 months? Yes.    Has the patient been a risk to self within the distant past? No.  Is the patient a risk to others? No.  Has  the patient been a risk to others in the past 6 months? No.  Has the patient been a risk to others within the distant past? No.   [place] Scale:  Flowsheet Row [facility] (Current) [facility] Most recent reading at 07/03/2023  3:00 PM ED from 07/03/2023 in [facility] Emergency Department at [facility] Most recent reading at 07/03/2023  7:00 AM ED from 12/13/2022 in [facility] Most recent reading at 12/13/2022  6:09 PM  C-SSRS RISK CATEGORY High Risk High Risk Low Risk        Prior Inpatient Therapy: No. If yes, describe  Prior Outpatient Therapy: Yes.   If yes, describe pcp    Alcohol Screening: 1. How often do you have a drink containing alcohol?: Never 2. How many drinks containing alcohol do you have on a typical day when you are drinking?: 1 or 2 3. How often do you have six or more drinks on one occasion?: Never AUDIT-C Score: 0 4. How often during the last year have you found that you were not able to stop drinking once you had started?: Never 5. How often during the last year  have you failed to do what was normally expected from you because of drinking?: Never 6. How often during the last year have you needed a first drink in the morning to get yourself going after a heavy drinking session?: Never 7. How often during the last year have you had a feeling of guilt of remorse after drinking?: Never 8. How often during the last year have you been unable to remember what happened the night before because you had been drinking?: Never 9. Have you or someone else been injured as a result of your drinking?: No 10. Has a relative or friend or a doctor or another health worker been concerned about your drinking or suggested you cut down?: No Alcohol Use Disorder Identification Test Final Score (AUDIT): 0 Substance Abuse History in the last 12 months:  Yes.   Consequences of Substance  Abuse: Negative Previous Psychotropic Medications: Yes  Psychological Evaluations: Yes  Past Medical History:  Past Medical History:  Diagnosis Date   Cocaine abuse with cocaine-induced mood disorder (HCC) 01/28/2021   Hypertension    Nephrolithiasis 02/15/2022   Obesity    Palpitations    Renal cell carcinoma (HCC)    Renal disorder    kidney stones   Severe cocaine use disorder (HCC) 01/28/2021   Supraventricular tachycardia 05/30/2011   SVT (supraventricular tachycardia)     Past Surgical History:  Procedure Laterality Date   TONSILLECTOMY     Family History:  Family History  Problem Relation Age of Onset   Hypertension Mother    Lupus Brother    Heart attack Maternal Grandfather        X2    Tobacco Screening:  Social History   Tobacco Use  Smoking Status Never  Smokeless Tobacco Current    BH Tobacco Counseling     Are you interested in Tobacco Cessation Medications?  No value filed. Counseled patient on smoking cessation:  No value filed. Reason Tobacco Screening Not Completed: No value filed.       Social History:  Social History   Substance and Sexual Activity  Alcohol Use Not Currently   Comment: occasionally     Social History   Substance and Sexual Activity  Drug Use Yes   Types: Cocaine    Additional Social History: Marital status: Married Number of Years Married: 13 What types of issues is patient dealing with in the relationship?: PT DRUG USE HAS CASUED ISSUES IN HIS MARRIAGE Additional relationship information: NA Are you sexually active?: No What is your sexual orientation?: Straight Has your sexual activity been affected by drugs, alcohol, medication, or emotional stress?: Yes, when I m using Does patient have children?: Yes How many children?: 3 How is patient's relationship with their children?: Poor, due to drug use                         Allergies:   Allergies  Allergen Reactions   Coconut Fatty Acids  Anaphylaxis and Hives   Sulfa Antibiotics Hives and Rash   Lab Results:  Results for orders placed or performed during the hospital encounter of 07/03/23 (from the past 48 hour(s))  Comprehensive metabolic panel     Status: Abnormal   Collection Time: 07/03/23  7:35 AM  Result Value Ref Range   Sodium 138 135 - 145 mmol/L   Potassium 3.5 3.5 - 5.1 mmol/L   Chloride 101 98 - 111 mmol/L   CO2 28 22 - 32 mmol/L  Glucose, Bld 113 (H) 70 - 99 mg/dL    Comment: Glucose reference range applies only to samples taken after fasting for at least 8 hours.   BUN 14 6 - 20 mg/dL   Creatinine, Ser 8.29 0.61 - 1.24 mg/dL   Calcium 56.2 8.9 - 13.0 mg/dL   Total Protein 7.6 6.5 - 8.1 g/dL   Albumin 4.5 3.5 - 5.0 g/dL   AST 15 15 - 41 U/L   ALT 12 0 - 44 U/L   Alkaline Phosphatase 112 38 - 126 U/L   Total Bilirubin 1.6 (H) 0.3 - 1.2 mg/dL   GFR, Estimated >86 >57 mL/min    Comment: (NOTE) Calculated using the CKD-EPI Creatinine Equation (2021)    Anion gap 9 5 - 15    Comment: Performed at [facility] Lab at [facility], [address], [place], [place] [address]  Ethanol     Status: None   Collection Time: 07/03/23  7:35 AM  Result Value Ref Range   Alcohol, Ethyl (B) <10 <10 mg/dL    Comment: (NOTE) Lowest detectable limit for serum alcohol is 10 mg/dL.  For medical purposes only. Performed at [facility], [address] [place], [place] [address]   CBC with Diff     Status: Abnormal   Collection Time: 07/03/23  7:35 AM  Result Value Ref Range   WBC 9.8 4.0 - 10.5 K/uL   RBC 4.54 4.22 - 5.81 MIL/uL   Hemoglobin 13.7 13.0 - 17.0 g/dL   HCT 41.3 24.4 - 01.0 %   MCV 85.9 80.0 - 100.0 fL   MCH 30.2 26.0 - 34.0 pg   MCHC 35.1 30.0 - 36.0 g/dL   RDW 27.2 53.6 - 64.4 %   Platelets 257 150 - 400 K/uL   nRBC 0.0 0.0 - 0.2 %   Neutrophils Relative % 80 %   Neutro Abs 7.8 (H) 1.7 - 7.7 K/uL   Lymphocytes Relative 13 %   Lymphs Abs 1.2 0.7 - 4.0 K/uL    Monocytes Relative 7 %   Monocytes Absolute 0.7 0.1 - 1.0 K/uL   Eosinophils Relative 0 %   Eosinophils Absolute 0.0 0.0 - 0.5 K/uL   Basophils Relative 0 %   Basophils Absolute 0.0 0.0 - 0.1 K/uL   Immature Granulocytes 0 %   Abs Immature Granulocytes 0.03 0.00 - 0.07 K/uL    Comment: Performed at [facility], [address] [place], [place] [address]  Urine rapid drug screen (hosp performed)     Status: Abnormal   Collection Time: 07/03/23  8:41 AM  Result Value Ref Range   Opiates NONE DETECTED NONE DETECTED   Cocaine POSITIVE (A) NONE DETECTED   Benzodiazepines NONE DETECTED NONE DETECTED   Amphetamines POSITIVE (A) NONE DETECTED   Tetrahydrocannabinol POSITIVE (A) NONE DETECTED   Barbiturates NONE DETECTED NONE DETECTED    Comment: (NOTE) DRUG SCREEN FOR MEDICAL PURPOSES ONLY.  IF CONFIRMATION IS NEEDED FOR ANY PURPOSE, NOTIFY LAB WITHIN 5 DAYS.  LOWEST DETECTABLE LIMITS FOR URINE DRUG SCREEN Drug Class                     Cutoff (ng/mL) Amphetamine and metabolites    1000 Barbiturate and metabolites    200 Benzodiazepine                 200 Opiates and metabolites        300  Cocaine and metabolites        300 THC                            50 Performed at [facility], [address] [place], [place] [address]     Blood Alcohol level:  Lab Results  Component Value Date   [facility] <10 07/03/2023    Metabolic Disorder Labs:  Lab Results  Component Value Date   HGBA1C 5.0 12/16/2022   MPG 96.8 12/16/2022   No results found for: "PROLACTIN" Lab Results  Component Value Date   CHOL 197 12/16/2022   TRIG 240 (H) 12/16/2022   HDL 26 (L) 12/16/2022   CHOLHDL 7.6 12/16/2022   VLDL 48 (H) 12/16/2022   LDLCALC 123 (H) 12/16/2022   LDLCALC 80 10/06/2014    Current Medications: Current Facility-Administered Medications  Medication Dose Route Frequency Provider Last Rate Last Admin   acetaminophen (TYLENOL) tablet 650 mg  650 mg Oral Q6H  PRN Lenard Lance, FNP       alum & mag hydroxide-simeth (MAALOX/MYLANTA) 200-200-20 MG/5ML suspension 30 mL  30 mL Oral Q4H PRN Lenard Lance, FNP       buPROPion (WELLBUTRIN XL) 24 hr tablet 150 mg  150 mg Oral Daily Lenard Lance, FNP   150 mg at 07/04/23 0743   diphenhydrAMINE (BENADRYL) capsule 50 mg  50 mg Oral TID PRN Lenard Lance, FNP       Or   diphenhydrAMINE (BENADRYL) injection 50 mg  50 mg Intramuscular TID PRN Lenard Lance, FNP       haloperidol (HALDOL) tablet 5 mg  5 mg Oral TID PRN Lenard Lance, FNP       Or   haloperidol lactate (HALDOL) injection 5 mg  5 mg Intramuscular TID PRN Lenard Lance, FNP       hydrOXYzine (ATARAX) tablet 25 mg  25 mg Oral TID PRN Lenard Lance, FNP       LORazepam (ATIVAN) tablet 2 mg  2 mg Oral TID PRN Lenard Lance, FNP       Or   LORazepam (ATIVAN) injection 2 mg  2 mg Intramuscular TID PRN Lenard Lance, FNP       magnesium hydroxide (MILK OF MAGNESIA) suspension 30 mL  30 mL Oral Daily PRN Lenard Lance, FNP       multivitamin with minerals tablet 1 tablet  1 tablet Oral Daily Lenard Lance, FNP   1 tablet at 07/04/23 0743   nicotine (NICODERM CQ - dosed in mg/24 hours) patch 14 mg  14 mg Transdermal Daily Royalti Schauf, Harrold Donath, MD   14 mg at 07/04/23 0745   pantoprazole (PROTONIX) EC tablet 20 mg  20 mg Oral Daily Lenard Lance, FNP   20 mg at 07/04/23 0743   traZODone (DESYREL) tablet 50 mg  50 mg Oral QHS PRN Lenard Lance, FNP       PTA Medications: Medications Prior to Admission  Medication Sig Dispense Refill Last Dose   allopurinol (ZYLOPRIM) 300 MG tablet Take 300 mg by mouth daily.   07/02/2023   amLODipine-benazepril (LOTREL) 10-40 MG capsule Take 1 capsule by mouth daily. 30 capsule 0 07/02/2023   atenolol (TENORMIN) 100 MG tablet Take 1 tablet (100 mg total) by mouth daily. 30 tablet 0 07/02/2023   atomoxetine (STRATTERA) 80 MG capsule Take 80 mg by mouth daily.  07/02/2023   buPROPion (WELLBUTRIN XL) 150 MG 24 hr tablet Take 1  tablet (150 mg total) by mouth daily. 30 tablet 0 07/02/2023   FLUoxetine (PROZAC) 40 MG capsule Take 1 capsule (40 mg total) by mouth daily. 30 capsule 0 07/02/2023   oxyCODONE (OXY IR/ROXICODONE) 5 MG immediate release tablet Take 5 mg by mouth every 6 (six) hours as needed for severe pain.   Past Week   pantoprazole (PROTONIX) 20 MG tablet Take 1 tablet (20 mg total) by mouth daily. 30 tablet 0 07/02/2023   hydrOXYzine (ATARAX) 25 MG tablet Take 1 tablet (25 mg total) by mouth 3 (three) times daily as needed for anxiety (for anxiety). (Patient not taking: Reported on 07/03/2023) 30 tablet 0 Not Taking   traZODone (DESYREL) 50 MG tablet Take 1 tablet (50 mg total) by mouth at bedtime as needed for sleep (for insomnia). (Patient not taking: Reported on 07/03/2023) 30 tablet 0 Not Taking    Musculoskeletal: Strength & Muscle Tone: within normal limits Gait & Station: normal Patient leans: N/A            Psychiatric Specialty Exam:  Presentation  General Appearance:  Disheveled  Eye Contact: Fair  Speech: Slow  Speech Volume: Decreased  Handedness: Right   Mood and Affect  Mood: Anxious; Depressed  Affect: Tearful; Congruent; Depressed   Thought Process  Thought Processes: Linear  Duration of Psychotic Symptoms: years  Past Diagnosis of Schizophrenia or Psychoactive disorder: No  Descriptions of Associations:Intact  Orientation:Full (Time, Place and Person)  Thought Content:Logical  Hallucinations:Hallucinations: None  Ideas of Reference:None  Suicidal Thoughts:Suicidal Thoughts: Yes, Passive SI Passive Intent and/or Plan: Without Intent; Without Plan  Homicidal Thoughts:Homicidal Thoughts: No   Sensorium  Memory: Immediate Fair; Recent Fair; Remote Fair  Judgment: Impaired  Insight: Lacking   Executive Functions  Concentration: Poor  Attention Span: Poor  Recall: Good  Fund of Knowledge: Good  Language: Good   Psychomotor  Activity  Psychomotor Activity: Psychomotor Activity: Normal   Assets  Assets: Communication Skills; Desire for Improvement; Financial Resources/Insurance; Resilience; Social Support   Sleep  Sleep: Sleep: Poor    Physical Exam: Physical Exam Vitals reviewed.  Constitutional:      General: He is not in acute distress.    Appearance: He is not toxic-appearing.  Pulmonary:     Effort: Pulmonary effort is normal. No respiratory distress.  Neurological:     Mental Status: He is alert.     Motor: No weakness.     Gait: Gait normal.  Psychiatric:        Behavior: Behavior normal.        Judgment: Judgment normal.    Review of Systems  Constitutional:  Negative for chills and fever.  Cardiovascular:  Negative for chest pain and palpitations.  Neurological:  Negative for dizziness, tingling, tremors and headaches.  Psychiatric/Behavioral:  Positive for depression, substance abuse and suicidal ideas. Negative for hallucinations and memory loss. The patient is nervous/anxious and has insomnia.   All other systems reviewed and are negative.  Blood pressure (!) 125/92, pulse 70, temperature 98 F (36.7 C), temperature source Oral, resp. rate 20, height 5\' 10"  (1.778 m), weight 129.3 kg, SpO2 100%. Body mass index is 40.89 kg/m.  Treatment Plan Summary: Daily contact with patient to assess and evaluate symptoms and progress in treatment   ASSESSMENT:  Diagnoses / Active Problems: MDD severe recurrent without psychotic features GAD PTSD Stimulant use disorder Cannabis abuse History of alcohol use Rule  out opiate use disorder   PLAN: Safety and Monitoring:  --  Voluntary admission to inpatient psychiatric unit for safety, stabilization and treatment  -- Daily contact with patient to assess and evaluate symptoms and progress in treatment  -- Patient's case to be discussed in multi-disciplinary team meeting  -- Observation Level : q15 minute checks  -- Vital signs:   q12 hours  -- Precautions: suicide, elopement, and assault  2. Psychiatric Diagnoses and Treatment:    -Stop Prozac -Stop Wellbutrin -Hold Strattera for now -Start Effexor 37.5 mg once daily today, and increased to 75 mg once daily tomorrow -Consider starting Abilify in the future to augment antidepressant -Start hydroxyzine as needed for anxiety and trazodone as needed for insomnia    --  The risks/benefits/side-effects/alternatives to this medication were discussed in detail with the patient and time was given for questions. The patient consents to medication trial.    -- Metabolic profile and EKG monitoring obtained while on an atypical antipsychotic (BMI: Lipid Panel: HbgA1c: QTc:)   -- Encouraged patient to participate in unit milieu and in scheduled group therapies   -- Short Term Goals: Ability to identify changes in lifestyle to reduce recurrence of condition will improve, Ability to verbalize feelings will improve, Ability to disclose and discuss suicidal ideas, Ability to demonstrate self-control will improve, Ability to identify and develop effective coping behaviors will improve, Ability to maintain clinical measurements within normal limits will improve, Compliance with prescribed medications will improve, and Ability to identify triggers associated with substance abuse/mental health issues will improve  -- Long Term Goals: Improvement in symptoms so as ready for discharge    3. Medical Issues Being Addressed:   Tobacco Use Disorder  -- Nicotine patch 14 mg/24 hours ordered  -- Smoking cessation encouraged  Resume home medications for gout and hypertension   4. Discharge Planning:   -- Social work and case management to assist with discharge planning and identification of hospital follow-up needs prior to discharge  -- Estimated LOS: 5-7 days  -- Discharge Concerns: Need to establish a safety plan; Medication compliance and effectiveness  -- Discharge Goals: Return home  with outpatient referrals for mental health follow-up including medication management/psychotherapy   I certify that inpatient services furnished can reasonably be expected to improve the patient's condition.    Cristy Hilts, MD 8/30/20243:04 PM    Total Time Spent in Direct Patient Care:  I personally spent 65 minutes on the unit in direct patient care. The direct patient care time included face-to-face time with the patient, reviewing the patient's chart, communicating with other professionals, and coordinating care. Greater than 50% of this time was spent in counseling or coordinating care with the patient regarding goals of hospitalization, psycho-education, and discharge planning needs.   Phineas Inches, MD Psychiatrist

## 2023-07-04 NOTE — BH IP Treatment Plan (Signed)
Interdisciplinary Treatment and Diagnostic Plan Update  07/04/2023 Time of Session: 11:00 AM  Tyrone Barnett MRN: [id number]  Principal Diagnosis: Substance induced mood disorder (HCC)  Secondary Diagnoses: Principal Problem:   Substance induced mood disorder (HCC)   Current Medications:  Current Facility-Administered Medications  Medication Dose Route Frequency Provider Last Rate Last Admin   acetaminophen (TYLENOL) tablet 650 mg  650 mg Oral Q6H PRN Lenard Lance, FNP       alum & mag hydroxide-simeth (MAALOX/MYLANTA) 200-200-20 MG/5ML suspension 30 mL  30 mL Oral Q4H PRN Lenard Lance, FNP       buPROPion (WELLBUTRIN XL) 24 hr tablet 150 mg  150 mg Oral Daily Lenard Lance, FNP   150 mg at 07/04/23 0743   diphenhydrAMINE (BENADRYL) capsule 50 mg  50 mg Oral TID PRN Lenard Lance, FNP       Or   diphenhydrAMINE (BENADRYL) injection 50 mg  50 mg Intramuscular TID PRN Lenard Lance, FNP       haloperidol (HALDOL) tablet 5 mg  5 mg Oral TID PRN Lenard Lance, FNP       Or   haloperidol lactate (HALDOL) injection 5 mg  5 mg Intramuscular TID PRN Lenard Lance, FNP       hydrOXYzine (ATARAX) tablet 25 mg  25 mg Oral TID PRN Lenard Lance, FNP       LORazepam (ATIVAN) tablet 2 mg  2 mg Oral TID PRN Lenard Lance, FNP       Or   LORazepam (ATIVAN) injection 2 mg  2 mg Intramuscular TID PRN Lenard Lance, FNP       magnesium hydroxide (MILK OF MAGNESIA) suspension 30 mL  30 mL Oral Daily PRN Lenard Lance, FNP       multivitamin with minerals tablet 1 tablet  1 tablet Oral Daily Lenard Lance, FNP   1 tablet at 07/04/23 0743   nicotine (NICODERM CQ - dosed in mg/24 hours) patch 14 mg  14 mg Transdermal Daily Massengill, Harrold Donath, MD   14 mg at 07/04/23 0745   pantoprazole (PROTONIX) EC tablet 20 mg  20 mg Oral Daily Lenard Lance, FNP   20 mg at 07/04/23 0743   traZODone (DESYREL) tablet 50 mg  50 mg Oral QHS PRN Lenard Lance, FNP       PTA Medications: Medications Prior to Admission   Medication Sig Dispense Refill Last Dose   allopurinol (ZYLOPRIM) 300 MG tablet Take 300 mg by mouth daily.   07/02/2023   amLODipine-benazepril (LOTREL) 10-40 MG capsule Take 1 capsule by mouth daily. 30 capsule 0 07/02/2023   atenolol (TENORMIN) 100 MG tablet Take 1 tablet (100 mg total) by mouth daily. 30 tablet 0 07/02/2023   atomoxetine (STRATTERA) 80 MG capsule Take 80 mg by mouth daily.   07/02/2023   buPROPion (WELLBUTRIN XL) 150 MG 24 hr tablet Take 1 tablet (150 mg total) by mouth daily. 30 tablet 0 07/02/2023   FLUoxetine (PROZAC) 40 MG capsule Take 1 capsule (40 mg total) by mouth daily. 30 capsule 0 07/02/2023   oxyCODONE (OXY IR/ROXICODONE) 5 MG immediate release tablet Take 5 mg by mouth every 6 (six) hours as needed for severe pain.   Past Week   pantoprazole (PROTONIX) 20 MG tablet Take 1 tablet (20 mg total) by mouth daily. 30 tablet 0 07/02/2023   hydrOXYzine (ATARAX) 25 MG tablet Take 1 tablet (25 mg total) by mouth 3 (  three) times daily as needed for anxiety (for anxiety). (Patient not taking: Reported on 07/03/2023) 30 tablet 0 Not Taking   traZODone (DESYREL) 50 MG tablet Take 1 tablet (50 mg total) by mouth at bedtime as needed for sleep (for insomnia). (Patient not taking: Reported on 07/03/2023) 30 tablet 0 Not Taking    Patient Stressors: Financial difficulties   Marital or family conflict   Occupational concerns   Substance abuse    Patient Strengths: Average or above average intelligence   Treatment Modalities: Medication Management, Group therapy, Case management,  1 to 1 session with clinician, Psychoeducation, Recreational therapy.   Physician Treatment Plan for Primary Diagnosis: Substance induced mood disorder (HCC) Long Term Goal(s):     Short Term Goals:    Medication Management: Evaluate patient's response, side effects, and tolerance of medication regimen.  Therapeutic Interventions: 1 to 1 sessions, Unit Group sessions and Medication  administration.  Evaluation of Outcomes: Not Progressing  Physician Treatment Plan for Secondary Diagnosis: Principal Problem:   Substance induced mood disorder (HCC)  Long Term Goal(s):     Short Term Goals:       Medication Management: Evaluate patient's response, side effects, and tolerance of medication regimen.  Therapeutic Interventions: 1 to 1 sessions, Unit Group sessions and Medication administration.  Evaluation of Outcomes: Not Progressing   RN Treatment Plan for Primary Diagnosis: Substance induced mood disorder (HCC) Long Term Goal(s): Knowledge of disease and therapeutic regimen to maintain health will improve  Short Term Goals: Ability to remain free from injury will improve, Ability to verbalize frustration and anger appropriately will improve, Ability to demonstrate self-control, Ability to participate in decision making will improve, Ability to verbalize feelings will improve, Ability to disclose and discuss suicidal ideas, Ability to identify and develop effective coping behaviors will improve, and Compliance with prescribed medications will improve  Medication Management: RN will administer medications as ordered by provider, will assess and evaluate patient's response and provide education to patient for prescribed medication. RN will report any adverse and/or side effects to prescribing provider.  Therapeutic Interventions: 1 on 1 counseling sessions, Psychoeducation, Medication administration, Evaluate responses to treatment, Monitor vital signs and CBGs as ordered, Perform/monitor CIWA, COWS, AIMS and Fall Risk screenings as ordered, Perform wound care treatments as ordered.  Evaluation of Outcomes: Not Progressing   LCSW Treatment Plan for Primary Diagnosis: Substance induced mood disorder (HCC) Long Term Goal(s): Safe transition to appropriate next level of care at discharge, Engage patient in therapeutic group addressing interpersonal concerns.  Short Term  Goals: Engage patient in aftercare planning with referrals and resources, Increase social support, Increase ability to appropriately verbalize feelings, Increase emotional regulation, Facilitate acceptance of mental health diagnosis and concerns, Facilitate patient progression through stages of change regarding substance use diagnoses and concerns, Identify triggers associated with mental health/substance abuse issues, and Increase skills for wellness and recovery  Therapeutic Interventions: Assess for all discharge needs, 1 to 1 time with Social worker, Explore available resources and support systems, Assess for adequacy in community support network, Educate family and significant other(s) on suicide prevention, Complete Psychosocial Assessment, Interpersonal group therapy.  Evaluation of Outcomes: Not Progressing   Progress in Treatment: Attending groups: No. Participating in groups: No. Taking medication as prescribed: No. Toleration medication: No. Family/Significant other contact made: No, will contact:  Whoever pt give CSW permission to speak with  Patient understands diagnosis: Yes. Discussing patient identified problems/goals with staff: Yes. Medical problems stabilized or resolved: Yes. Denies suicidal/homicidal ideation: Yes. Issues/concerns  per patient self-inventory: No.   New problem(s) identified: No, Describe:  None reported   New Short Term/Long Term Goal(s):detox, medication management for mood stabilization; elimination of SI thoughts; development of comprehensive mental wellness/sobriety plan   Patient Goals:  " I don't have a goal , but I do what to figure out why I keep going back to the same shit "   Discharge Plan or Barriers: Patient recently admitted. CSW will continue to follow and assess for appropriate referrals and possible discharge planning.  Pt may be homeless since he was recently kicked out of the home where wife and kids are   Reason for Continuation of  Hospitalization: Withdrawal symptoms Other; describe Relapsed on cocaine   Estimated Length of Stay: 3-5 days   Last 3 [place] Suicide Severity Risk Score: Flowsheet Row [facility] (Current[facility] Most recent reading at 07/03/2023  3:00 PM ED from 07/03/2023 in [facility] Department at [facility] Most recent reading at 07/03/2023  7:00 AM ED from 12/13/2022 in [facility] Most recent reading at 12/13/2022  6:09 PM  C-SSRS RISK CATEGORY High Risk High Risk Low Risk       Last PHQ 2/9 Scores:    12/16/2022   10:48 AM 12/13/2022    2:49 PM 10/06/2014    9:43 AM  Depression screen PHQ 2/9  Decreased Interest 1 1 0  Down, Depressed, Hopeless 1 1 0  PHQ - 2 Score 2 2 0  Altered sleeping  1   Tired, decreased energy  1   Change in appetite  0   Feeling bad or failure about yourself   1   Trouble concentrating  1   Moving slowly or fidgety/restless  1   Suicidal thoughts  0   PHQ-9 Score  7   Difficult doing work/chores  Very difficult     Scribe for Treatment Team: Beather Arbour 07/04/2023 11:36 AM

## 2023-07-04 NOTE — Progress Notes (Addendum)
Patient is complaining of back pain. Patient rates his pain a 7/10. PRN Oxycodone 5mg  administered at 1636 per [facility] for back pain with good effect.

## 2023-07-04 NOTE — Group Note (Signed)
Recreation Therapy Group Note   Group Topic:Team Building  Group Date: 07/04/2023 Start Time: 0930 End Time: 1000 Facilitators: Braylee Bosher-McCall, LRT,CTRS Location: [address] Dayroom   Goal Area(s) Addresses:  Patient will effectively work with peer towards shared goal.  Patient will identify skills used to make activity successful.  Patient will identify how skills used during activity can be applied to reach post d/c goals.   Group Description: [employer]. In teams of 5-6, patients were given 11 craft pipe cleaners. Using the materials provided, patients were instructed to compete again the opposing team(s) to build the tallest free-standing structure from floor level. The activity was timed; difficulty increased by Clinical research associate as Production designer, theatre/television/film continued.  Systematically resources were removed with additional directions for example, placing one arm behind their back, working in silence, and shape stipulations. LRT facilitated post-activity discussion reviewing team processes and necessary communication skills involved in completion. Patients were encouraged to reflect how the skills utilized, or not utilized, in this activity can be incorporated to positively impact support systems post discharge.   Clinical Observations/Individualized Feedback: Group did not occur due to previous group going over into group time.    Plan: Continue to engage patient in RT group sessions 2-3x/week.   Melis Trochez-McCall, LRT,CTRS 07/04/2023 12:18 PM
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
## 2023-07-05 DIAGNOSIS — F332 Major depressive disorder, recurrent severe without psychotic features: Secondary | ICD-10-CM | POA: Diagnosis not present

## 2023-07-05 NOTE — Progress Notes (Signed)
[facility] MD Progress Note  07/05/2023 12:11 PM Tyrone Barnett  MRN:  [id number]  Subjective: Chart reviewed, case discussed with multidisciplinary meeting today, patient seen during rounds.  Patient continues to report depressed mood, anhedonia, poor sleep, and he feels helpless and hopeless.  He reports his stressors include separation from his wife, his medical conditions including renal carcinoma.  Patient was provided with support and reassurance.  Patient was focused on pain medicines.  He was informed that he has been started on oxycodone every 6 hours for pain.  He was encouraged to Tylenol between 2 doses of oxycodone for breakthrough pain.  Patient tolerated Effexor fine without any side effect, dose is increased to 75 mg.  Patient was encouraged to attend group and work on coping strategies at a safe discharge plan.  He was encouraged to consider individual psychotherapy upon discharge  Principal Problem: MDD (major depressive disorder), recurrent severe, without psychosis (HCC) Diagnosis: Principal Problem:   MDD (major depressive disorder), recurrent severe, without psychosis (HCC) Active Problems:   Hypertension   Severe cocaine use disorder (HCC)   GAD (generalized anxiety disorder)   PTSD (post-traumatic stress disorder)   Past Psychiatric History: MDD, GAD, PTSD, ADHD Patient denies any history of psychiatric hospitalization Patient denies any history of suicide attempts Current psychiatric medications: Prozac Wellbutrin and Strattera Past psychiatric medication trials: Patient reports he has been on multiple psychiatric medications past but does not recall any of the specific names of these medications or if they were effective or not  Past Medical History:  Past Medical History:  Diagnosis Date   Cocaine abuse with cocaine-induced mood disorder (HCC) 01/28/2021   Hypertension    Nephrolithiasis 02/15/2022   Obesity    Palpitations    Renal cell carcinoma (HCC)    Renal  disorder    kidney stones   Severe cocaine use disorder (HCC) 01/28/2021   Supraventricular tachycardia 05/30/2011   SVT (supraventricular tachycardia)     Past Surgical History:  Procedure Laterality Date   TONSILLECTOMY     Family History:  Family History  Problem Relation Age of Onset   Hypertension Mother    Lupus Brother    Heart attack Maternal Grandfather        X2    Social History:  Social History   Substance and Sexual Activity  Alcohol Use Not Currently   Comment: occasionally     Social History   Substance and Sexual Activity  Drug Use Yes   Types: Cocaine    Social History   Socioeconomic History   Marital status: Married    Spouse name: Not on file   Number of children: Not on file   Years of education: Not on file   Highest education level: Not on file  Occupational History   Not on file  Tobacco Use   Smoking status: Never   Smokeless tobacco: Current  Vaping Use   Vaping status: Every Day  Substance and Sexual Activity   Alcohol use: Not Currently    Comment: occasionally   Drug use: Yes    Types: Cocaine   Sexual activity: Not on file  Other Topics Concern   Not on file  Social History Narrative   Not on file   Social Determinants of Health   Financial Resource Strain: Not on file  Food Insecurity: No Food Insecurity (07/03/2023)   Hunger Vital Sign    Worried About Running Out of Food in the Last Year: Never true  Ran Out of Food in the Last Year: Never true  Transportation Needs: No Transportation Needs (07/03/2023)   PRAPARE - Administrator, Civil Service (Medical): No    Lack of Transportation (Non-Medical): No  Physical Activity: Not on file  Stress: Not on file  Social Connections: Unknown (03/16/2022)   Received from [facility]   Social Network    Social Network: Not on file   Additional Social History:                         Sleep: Poor  Appetite:  Fair  Current  Medications: Current Facility-Administered Medications  Medication Dose Route Frequency Provider Last Rate Last Admin   acetaminophen (TYLENOL) tablet 650 mg  650 mg Oral Q6H PRN Lenard Lance, FNP       allopurinol (ZYLOPRIM) tablet 300 mg  300 mg Oral Daily Massengill, Harrold Donath, MD   300 mg at 07/05/23 0747   alum & mag hydroxide-simeth (MAALOX/MYLANTA) 200-200-20 MG/5ML suspension 30 mL  30 mL Oral Q4H PRN Lenard Lance, FNP       amLODipine (NORVASC) tablet 10 mg  10 mg Oral Daily Massengill, Harrold Donath, MD   10 mg at 07/05/23 0747   atenolol (TENORMIN) tablet 100 mg  100 mg Oral Daily Massengill, Harrold Donath, MD   100 mg at 07/05/23 0747   benazepril (LOTENSIN) tablet 40 mg  40 mg Oral Daily Massengill, Harrold Donath, MD       diphenhydrAMINE (BENADRYL) capsule 50 mg  50 mg Oral TID PRN Lenard Lance, FNP       Or   diphenhydrAMINE (BENADRYL) injection 50 mg  50 mg Intramuscular TID PRN Lenard Lance, FNP       haloperidol (HALDOL) tablet 5 mg  5 mg Oral TID PRN Lenard Lance, FNP       Or   haloperidol lactate (HALDOL) injection 5 mg  5 mg Intramuscular TID PRN Lenard Lance, FNP       hydrOXYzine (ATARAX) tablet 25 mg  25 mg Oral TID PRN Lenard Lance, FNP       LORazepam (ATIVAN) tablet 2 mg  2 mg Oral TID PRN Lenard Lance, FNP       Or   LORazepam (ATIVAN) injection 2 mg  2 mg Intramuscular TID PRN Lenard Lance, FNP       magnesium hydroxide (MILK OF MAGNESIA) suspension 30 mL  30 mL Oral Daily PRN Lenard Lance, FNP       multivitamin with minerals tablet 1 tablet  1 tablet Oral Daily Lenard Lance, FNP   1 tablet at 07/05/23 0747   nicotine (NICODERM CQ - dosed in mg/24 hours) patch 14 mg  14 mg Transdermal Daily Massengill, Harrold Donath, MD   14 mg at 07/05/23 0748   oxyCODONE (Oxy IR/ROXICODONE) immediate release tablet 5 mg  5 mg Oral Q6H PRN Massengill, Harrold Donath, MD   5 mg at 07/05/23 0932   pantoprazole (PROTONIX) EC tablet 20 mg  20 mg Oral Daily Lenard Lance, FNP   20 mg at 07/05/23 0747    traZODone (DESYREL) tablet 50 mg  50 mg Oral QHS PRN Lenard Lance, FNP       [START ON 07/06/2023] venlafaxine XR (EFFEXOR-XR) 24 hr capsule 75 mg  75 mg Oral Q breakfast Massengill, Harrold Donath, MD        Lab Results: No results found for this or  any previous visit (from the past 48 hour(s)).  Blood Alcohol level:  Lab Results  Component Value Date   ETH <10 07/03/2023    Metabolic Disorder Labs: Lab Results  Component Value Date   HGBA1C 5.0 12/16/2022   MPG 96.8 12/16/2022   No results found for: "PROLACTIN" Lab Results  Component Value Date   CHOL 197 12/16/2022   TRIG 240 (H) 12/16/2022   HDL 26 (L) 12/16/2022   CHOLHDL 7.6 12/16/2022   VLDL 48 (H) 12/16/2022   LDLCALC 123 (H) 12/16/2022   LDLCALC 80 10/06/2014    Musculoskeletal: Strength & Muscle Tone: within normal limits Gait & Station: normal Patient leans: N/A                       Psychiatric Specialty Exam:   Presentation  General Appearance:  Improved   Eye Contact: Fair   Speech: Spontaneous with normal rate and volume   Speech Volume: Normal   Handedness: Right     Mood and Affect  Mood: " Depressed"   Affect: Constricted     Thought Process  Thought Processes: Linear    Past Diagnosis of Schizophrenia or Psychoactive disorder: No   Descriptions of Associations:Intact   Orientation:Full (Time, Place and Person)   Thought Content:Logical, goal directed   Hallucinations:Hallucinations: None   Ideas of Reference:None   Suicidal Thoughts:Suicidal Thoughts: Yes, Passive SI Passive Intent and/or Plan: Without Intent; Without Plan   Homicidal Thoughts:Homicidal Thoughts: No     Sensorium  Memory: Immediate Fair; Recent Fair; Remote Fair   Judgment: Impaired   Insight: Shallow     Executive Functions  Concentration: Fair   Attention Span: Fair   Recall: Good   Fund of Knowledge: Good   Language: Good     Psychomotor Activity  Psychomotor  Activity: Psychomotor Activity: Normal     Assets  Assets: Communication Skills; Desire for Improvement; Financial Resources/Insurance; Resilience; Social Support     Sleep  Sleep: Sleep: Poor        Physical Exam Vitals reviewed.  Constitutional:      General: He is not in acute distress.    Appearance: He is not toxic-appearing.  Pulmonary:     Effort: Pulmonary effort is normal. No respiratory distress.  Neurological:     Mental Status: He is alert.     Motor: No weakness.     Gait: Gait normal.  Psychiatric:        Behavior: Behavior normal.        Judgment: Judgment normal.      Review of Systems  Constitutional:  Negative for chills and fever.  Cardiovascular:  Negative for chest pain and palpitations.  Neurological:  Negative for dizziness, tingling, tremors and headaches.  Psychiatric/Behavioral:  Positive for depression, substance abuse and suicidal ideas. Negative for hallucinations and memory loss. The patient is nervous/anxious and has insomnia.   All other systems reviewed and are negative.  Blood pressure (!) 139/91, pulse 67, temperature 98.4 F (36.9 C), temperature source Oral, resp. rate 20, height 5\' 10"  (1.778 m), weight 129.3 kg, SpO2 99%. Body mass index is 40.89 kg/m.   Treatment Plan Summary: ASSESSMENT:   Diagnoses / Active Problems: MDD severe recurrent without psychotic features GAD PTSD Stimulant use disorder Cannabis abuse History of alcohol use Rule out opiate use disorder     PLAN: Safety and Monitoring:             --  Voluntary admission to  inpatient psychiatric unit for safety, stabilization and treatment             -- Daily contact with patient to assess and evaluate symptoms and progress in treatment             -- Patient's case to be discussed in multi-disciplinary team meeting             -- Observation Level : q15 minute checks             -- Vital signs:  q12 hours             -- Precautions: suicide, elopement,  and assault   2. Psychiatric Diagnoses and Treatment:               Increase the dose of Effexor to 75 mg once daily  -Consider starting Abilify in the future to augment antidepressant -Start hydroxyzine as needed for anxiety and trazodone as needed for insomnia   3. Medical Issues Being Addressed:              Tobacco Use Disorder             -- Nicotine patch 14 mg/24 hours ordered             -- Smoking cessation encouraged   Resume home medications for gout and hypertension     4. Discharge Planning:              -- Social work and case management to assist with discharge planning and identification of hospital follow-up needs prior to discharge             -- Estimated LOS: 4-5  days             -- Discharge Concerns: Need to establish a safety plan; Medication compliance and effectiveness             -- Discharge Goals: Return home with outpatient referrals for mental health follow-up including medication management/psychotherapy     I certify that inpatient services furnished can reasonably be expected to improve the patient's condition.      Lewanda Rife, MD

## 2023-07-05 NOTE — Plan of Care (Signed)
  Problem: Education: Goal: Emotional status will improve Outcome: Progressing Goal: Mental status will improve Outcome: Progressing   Problem: Activity: Goal: Sleeping patterns will improve Outcome: Progressing   

## 2023-07-05 NOTE — [facility] Suicide Risk Assessment (Signed)
[facility] INPATIENT:  Family/Significant Other Suicide Prevention Education  Suicide Prevention Education:  Education Completed; Kartikeya Eberle (Spouse) [phone],  has been identified by the patient as the family member/significant other with whom the patient will be residing, and identified as the person(s) who will aid the patient in the event of a mental health crisis (suicidal ideations/suicide attempt).    CSW spoke with spouse about safety upon discharge of patient. Patients spouse verbalized that the pt isn't allowed to return to her home. He will need shelter and transportation. CSW informed spouse that if the pt speaks with her and verbalize SI/HI/AVH to contact the SI resources provided for crisis intervention. Spouse verbalized understanding.    With written consent from the patient, the family member/significant other has been provided the following suicide prevention education, prior to the and/or following the discharge of the patient.  The suicide prevention education provided includes the following: Suicide risk factors Suicide prevention and interventions National Suicide Hotline telephone number [facility] assessment telephone number [facility] Emergency Assistance 911 [facility] and/or Residential Mobile Crisis Unit telephone number  Request made of family/significant other to: Remove weapons (e.g., guns, rifles, knives), all items previously/currently identified as safety concern.   Remove drugs/medications (over-the-counter, prescriptions, illicit drugs), all items previously/currently identified as a safety concern.  The family member/significant other verbalizes understanding of the suicide prevention education information provided.  The family member/significant other agrees to remove the items of safety concern listed above.  Steffanie Dunn LCSWA 07/05/2023, 9:23 AM

## 2023-07-05 NOTE — Progress Notes (Signed)
   07/04/23 2020  Psych Admission Type (Psych Patients Only)  Admission Status Voluntary  Psychosocial Assessment  Patient Complaints Depression  Eye Contact Brief  Facial Expression Flat  Affect Flat  Speech Logical/coherent  Interaction Isolative  Motor Activity Other (Comment) (wnl)  Appearance/Hygiene Unremarkable  Behavior Characteristics Unwilling to participate  Mood Depressed  Thought Process  Coherency WDL  Content Preoccupation  Delusions WDL  Perception WDL  Hallucination None reported or observed  Judgment Poor  Confusion None  Danger to Self  Current suicidal ideation? Denies

## 2023-07-05 NOTE — Plan of Care (Signed)
  Problem: Education: Goal: Emotional status will improve Outcome: Not Progressing Goal: Mental status will improve Outcome: Not Progressing   

## 2023-07-05 NOTE — Progress Notes (Signed)
Patient requested his pain medication early d/t the  excruciating pain waking him up from his sleep. Notified provider  Shalon B. NP and she consented to pain medication be given early. Patient received pain medication for pain rated at a 9. Will monitor effectiveness of medication given.

## 2023-07-05 NOTE — [facility] Group Notes (Signed)
[facility] Group Notes:  (Nursing/MHT/Case Management/Adjunct)  Date:  07/05/2023  Time:  8:57 PM  Type of Therapy:  Wrap Up Group  Participation Level:  Active  Participation Quality:   active  Affect:  Appropriate  Cognitive:  Appropriate  Insight:  Appropriate  Engagement in Group:  Improving  Modes of Intervention:  Discussion  Summary of Progress/Problems:Pt rated his day to be a 10/10  Khaalid Lefkowitz E Rayley Gao 07/05/2023, 8:57 PM

## 2023-07-05 NOTE — Progress Notes (Signed)
Writer spoke with patient 1:1 and he reports having had a better day today. He talked about his marital problems, his health, surgery needed, and his fathers death caused by the same cancer he has been diagnosed with currently. He reports having a lot to deal with currently. Support given and safety maintained with 15 min checks.   07/05/23 2010  Psych Admission Type (Psych Patients Only)  Admission Status Voluntary  Psychosocial Assessment  Patient Complaints Depression;Worrying  Eye Contact Brief  Facial Expression Flat  Affect Flat  Speech Logical/coherent  Interaction Assertive  Motor Activity Other (Comment) (wnl)  Appearance/Hygiene Unremarkable  Behavior Characteristics Cooperative;Appropriate to situation  Mood Depressed;Preoccupied;Pleasant  Thought Process  Coherency WDL  Content Preoccupation  Delusions WDL  Perception WDL  Hallucination None reported or observed  Judgment Poor  Confusion None  Danger to Self  Current suicidal ideation? Denies

## 2023-07-05 NOTE — Progress Notes (Addendum)
Pt denied SI/HI/AVH this morning. Pt rated his depression a 10/10, anxiety a 10/10, and feelings of hopelessness a 10/10. Pt complains of 8/10 chronic back pain, PRN Oxycodone administered per MAR. Pt has been cooperative throughout the shift. RN provided support and encouragement to patient. Pt given scheduled medications as prescribed. Q15 min checks verified for safety. Patient verbally contracts for safety. Patient compliant with medications and treatment plan. Pt is safe on the unit.   07/05/23 0854  Psych Admission Type (Psych Patients Only)  Admission Status Voluntary  Psychosocial Assessment  Patient Complaints Other (Comment);Depression (Pain in back)  Eye Contact Fair  Facial Expression Flat  Affect Irritable;Flat  Speech Logical/coherent  Interaction Assertive  Motor Activity Other (Comment) (WDL)  Appearance/Hygiene Unremarkable  Behavior Characteristics Cooperative;Irritable  Mood Depressed;Sad  Thought Process  Coherency WDL  Content Preoccupation (Preoccupied with back pain)  Delusions None reported or observed  Perception WDL  Hallucination None reported or observed  Judgment Impaired  Confusion None  Danger to Self  Current suicidal ideation? Denies  Description of Suicide Plan No plan  Self-Injurious Behavior No self-injurious ideation or behavior indicators observed or expressed   Agreement Not to Harm Self Yes  Description of Agreement Pt verbally contracts for safety  Danger to Others  Danger to Others None reported or observed

## 2023-07-06 DIAGNOSIS — F332 Major depressive disorder, recurrent severe without psychotic features: Secondary | ICD-10-CM | POA: Diagnosis not present

## 2023-07-06 LAB — GLUCOSE, CAPILLARY: Glucose-Capillary: 132 mg/dL — ABNORMAL HIGH (ref 70–99)

## 2023-07-06 MED ORDER — OXYCODONE HCL 5 MG PO TABS
5.0000 mg | ORAL_TABLET | Freq: Once | ORAL | Status: AC | PRN
  Administered 2023-07-06: 5 mg via ORAL
  Filled 2023-07-06: qty 1

## 2023-07-06 NOTE — Progress Notes (Signed)
Patient will discuss new medications with MD.  Rosezella Rumpf lotensin 40 mg.

## 2023-07-06 NOTE — [facility] Group Notes (Signed)
Psychoeducational Group Note  Date:  07/06/2023 Time:  2000  Group Topic/Focus:  Wrap up group  Participation Level: Did Not Attend  Participation Quality:  Not Applicable  Affect:  Not Applicable  Cognitive:  Not Applicable  Insight:  Not Applicable  Engagement in Group: Not Applicable  Additional Comments:  Did not attend.   Marcille Buffy 07/06/2023, 9:52 PM

## 2023-07-06 NOTE — Progress Notes (Signed)
[facility] MD Progress Note  07/06/2023 10:37 AM Tyrone Barnett  MRN:  [id number]  Subjective: Chart reviewed, case discussed with multidisciplinary meeting today, patient seen during rounds.  Patient continues to report depressed mood, anhedonia, poor sleep, and he feels helpless and hopeless.  He reports his stressors include separation from his wife, his medical conditions including renal carcinoma.  The patient is not interested in rehab for substance abuse but he is not sure where he is going to go once he is discharged from the hospital. The patient was encouraged to speak with social work about substance abuse treatment options.  Principal Problem: MDD (major depressive disorder), recurrent severe, without psychosis (HCC) Diagnosis: Principal Problem:   MDD (major depressive disorder), recurrent severe, without psychosis (HCC) Active Problems:   Hypertension   Severe cocaine use disorder (HCC)   GAD (generalized anxiety disorder)   PTSD (post-traumatic stress disorder)   Past Psychiatric History: MDD, GAD, PTSD, ADHD Patient denies any history of psychiatric hospitalization Patient denies any history of suicide attempts Current psychiatric medications: Prozac Wellbutrin and Strattera Past psychiatric medication trials: Patient reports he has been on multiple psychiatric medications past but does not recall any of the specific names of these medications or if they were effective or not  Past Medical History:  Past Medical History:  Diagnosis Date   Cocaine abuse with cocaine-induced mood disorder (HCC) 01/28/2021   Hypertension    Nephrolithiasis 02/15/2022   Obesity    Palpitations    Renal cell carcinoma (HCC)    Renal disorder    kidney stones   Severe cocaine use disorder (HCC) 01/28/2021   Supraventricular tachycardia 05/30/2011   SVT (supraventricular tachycardia)     Past Surgical History:  Procedure Laterality Date   TONSILLECTOMY     Family History:  Family History   Problem Relation Age of Onset   Hypertension Mother    Lupus Brother    Heart attack Maternal Grandfather        X2    Social History:  Social History   Substance and Sexual Activity  Alcohol Use Not Currently   Comment: occasionally     Social History   Substance and Sexual Activity  Drug Use Yes   Types: Cocaine    Social History   Socioeconomic History   Marital status: Married    Spouse name: Not on file   Number of children: Not on file   Years of education: Not on file   Highest education level: Not on file  Occupational History   Not on file  Tobacco Use   Smoking status: Never   Smokeless tobacco: Current  Vaping Use   Vaping status: Every Day  Substance and Sexual Activity   Alcohol use: Not Currently    Comment: occasionally   Drug use: Yes    Types: Cocaine   Sexual activity: Not on file  Other Topics Concern   Not on file  Social History Narrative   Not on file   Social Determinants of Health   Financial Resource Strain: Not on file  Food Insecurity: No Food Insecurity (07/03/2023)   Hunger Vital Sign    Worried About Running Out of Food in the Last Year: Never true    Ran Out of Food in the Last Year: Never true  Transportation Needs: No Transportation Needs (07/03/2023)   PRAPARE - Administrator, Civil Service (Medical): No    Lack of Transportation (Non-Medical): No  Physical Activity: Not on file  Stress: Not on file  Social Connections: Unknown (03/16/2022)   Received from [facility]   Social Network    Social Network: Not on file   Additional Social History:                         Sleep: Poor  Appetite:  Fair  Current Medications: Current Facility-Administered Medications  Medication Dose Route Frequency Provider Last Rate Last Admin   acetaminophen (TYLENOL) tablet 650 mg  650 mg Oral Q6H PRN Lenard Lance, FNP       allopurinol (ZYLOPRIM) tablet 300 mg  300 mg Oral Daily Massengill,  Harrold Donath, MD   300 mg at 07/06/23 0835   alum & mag hydroxide-simeth (MAALOX/MYLANTA) 200-200-20 MG/5ML suspension 30 mL  30 mL Oral Q4H PRN Lenard Lance, FNP       amLODipine (NORVASC) tablet 10 mg  10 mg Oral Daily Massengill, Harrold Donath, MD   10 mg at 07/06/23 0836   atenolol (TENORMIN) tablet 100 mg  100 mg Oral Daily Massengill, Harrold Donath, MD   100 mg at 07/06/23 0836   benazepril (LOTENSIN) tablet 40 mg  40 mg Oral Daily Massengill, Harrold Donath, MD       diphenhydrAMINE (BENADRYL) capsule 50 mg  50 mg Oral TID PRN Lenard Lance, FNP       Or   diphenhydrAMINE (BENADRYL) injection 50 mg  50 mg Intramuscular TID PRN Lenard Lance, FNP       haloperidol (HALDOL) tablet 5 mg  5 mg Oral TID PRN Lenard Lance, FNP       Or   haloperidol lactate (HALDOL) injection 5 mg  5 mg Intramuscular TID PRN Lenard Lance, FNP       hydrOXYzine (ATARAX) tablet 25 mg  25 mg Oral TID PRN Lenard Lance, FNP   25 mg at 07/05/23 2138   LORazepam (ATIVAN) tablet 2 mg  2 mg Oral TID PRN Lenard Lance, FNP       Or   LORazepam (ATIVAN) injection 2 mg  2 mg Intramuscular TID PRN Lenard Lance, FNP       magnesium hydroxide (MILK OF MAGNESIA) suspension 30 mL  30 mL Oral Daily PRN Lenard Lance, FNP       multivitamin with minerals tablet 1 tablet  1 tablet Oral Daily Lenard Lance, FNP   1 tablet at 07/06/23 0837   nicotine (NICODERM CQ - dosed in mg/24 hours) patch 14 mg  14 mg Transdermal Daily Massengill, Harrold Donath, MD   14 mg at 07/06/23 8295   oxyCODONE (Oxy IR/ROXICODONE) immediate release tablet 5 mg  5 mg Oral Q6H PRN Phineas Inches, MD   5 mg at 07/06/23 0843   pantoprazole (PROTONIX) EC tablet 20 mg  20 mg Oral Daily Lenard Lance, FNP   20 mg at 07/06/23 0837   traZODone (DESYREL) tablet 50 mg  50 mg Oral QHS PRN Lenard Lance, FNP       venlafaxine XR (EFFEXOR-XR) 24 hr capsule 75 mg  75 mg Oral Q breakfast Massengill, Harrold Donath, MD   75 mg at 07/06/23 6213    Lab Results: No results found for this or any previous  visit (from the past 48 hour(s)).  Blood Alcohol level:  Lab Results  Component Value Date   [facility] <10 07/03/2023    Metabolic Disorder Labs: Lab Results  Component Value Date   HGBA1C 5.0 12/16/2022  MPG 96.8 12/16/2022   No results found for: "PROLACTIN" Lab Results  Component Value Date   CHOL 197 12/16/2022   TRIG 240 (H) 12/16/2022   HDL 26 (L) 12/16/2022   CHOLHDL 7.6 12/16/2022   VLDL 48 (H) 12/16/2022   LDLCALC 123 (H) 12/16/2022   LDLCALC 80 10/06/2014    Musculoskeletal: Strength & Muscle Tone: within normal limits Gait & Station: normal Patient leans: N/A                       Psychiatric Specialty Exam:   Presentation  General Appearance:  Improved   Eye Contact: Fair   Speech: Spontaneous with normal rate and volume   Speech Volume: Normal   Handedness: Right     Mood and Affect  Mood: " Depressed"   Affect: Constricted     Thought Process  Thought Processes: Linear    Past Diagnosis of Schizophrenia or Psychoactive disorder: No   Descriptions of Associations:Intact   Orientation:Full (Time, Place and Person)   Thought Content:Logical, goal directed   Hallucinations:Hallucinations: None   Ideas of Reference:None   Suicidal Thoughts:Suicidal Thoughts: Yes, Passive SI Passive Intent and/or Plan: Without Intent; Without Plan   Homicidal Thoughts:Homicidal Thoughts: No     Sensorium  Memory: Immediate Fair; Recent Fair; Remote Fair   Judgment: Impaired   Insight: Shallow     Executive Functions  Concentration: Fair   Attention Span: Fair   Recall: Good   Fund of Knowledge: Good   Language: Good     Psychomotor Activity  Psychomotor Activity: Psychomotor Activity: Normal     Assets  Assets: Communication Skills; Desire for Improvement; Financial Resources/Insurance; Resilience; Social Support     Sleep  Sleep: Sleep: Poor        Physical Exam Vitals reviewed.   Constitutional:      General: He is not in acute distress.    Appearance: He is not toxic-appearing.  Pulmonary:     Effort: Pulmonary effort is normal. No respiratory distress.  Neurological:     Mental Status: He is alert.     Motor: No weakness.     Gait: Gait normal.  Psychiatric:        Behavior: Behavior normal.        Judgment: Judgment normal.      Review of Systems  Constitutional:  Negative for chills and fever.  Cardiovascular:  Negative for chest pain and palpitations.  Neurological:  Negative for dizziness, tingling, tremors and headaches.  Psychiatric/Behavioral:  Positive for depression, substance abuse and suicidal ideas. Negative for hallucinations and memory loss. The patient is nervous/anxious and has insomnia.   All other systems reviewed and are negative.  Blood pressure (!) 142/89, pulse 63, temperature 98.4 F (36.9 C), temperature source Oral, resp. rate 20, height 5\' 10"  (1.778 m), weight 129.3 kg, SpO2 97%. Body mass index is 40.89 kg/m.   Treatment Plan Summary: ASSESSMENT:   Diagnoses / Active Problems: MDD severe recurrent without psychotic features GAD PTSD Stimulant use disorder Cannabis abuse History of alcohol use Rule out opiate use disorder     PLAN: Safety and Monitoring:             --  Voluntary admission to inpatient psychiatric unit for safety, stabilization and treatment             -- Daily contact with patient to assess and evaluate symptoms and progress in treatment             --  Patient's case to be discussed in multi-disciplinary team meeting             -- Observation Level : q15 minute checks             -- Vital signs:  q12 hours             -- Precautions: suicide, elopement, and assault   2. Psychiatric Diagnoses and Treatment:               Continue Effexor to 75 mg once daily  -Consider starting Abilify in the future to augment antidepressant -Start hydroxyzine as needed for anxiety and trazodone as needed for  insomnia   3. Medical Issues Being Addressed:              Tobacco Use Disorder             -- Nicotine patch 14 mg/24 hours ordered             -- Smoking cessation encouraged   Resume home medications for gout and hypertension     4. Discharge Planning:              -- Social work and case management to assist with discharge planning and identification of hospital follow-up needs prior to discharge             -- Estimated LOS: 4-5  days             -- Discharge Concerns: Need to establish a safety plan; Medication compliance and effectiveness             -- Discharge Goals: Return home with outpatient referrals for mental health follow-up including medication management/psychotherapy     I certify that inpatient services furnished can reasonably be expected to improve the patient's condition.      Harlin Heys, DO

## 2023-07-06 NOTE — [facility] Group Notes (Signed)
Adult Psychoeducational Group Note  Date:  07/06/2023 Time:  1:26 PM  Group Topic/Focus:  Goals Group:   The focus of this group is to help patients establish daily goals to achieve during treatment and discuss how the patient can incorporate goal setting into their daily lives to aide in recovery. Orientation:   The focus of this group is to educate the patient on the purpose and policies of crisis stabilization and provide a format to answer questions about their admission.  The group details unit policies and expectations of patients while admitted.  Participation Level:  Active  Participation Quality:  Attentive  Affect:  Appropriate  Cognitive:  Alert  Insight: Appropriate  Engagement in Group:  Engaged  Modes of Intervention:  Discussion  Additional Comments:  Patient attended and participated in the goals group.  Jearl Klinefelter 07/06/2023, 1:26 PM

## 2023-07-06 NOTE — Progress Notes (Signed)
Patient c/o  right flank pain and reports that he feels like his kidney stones are moving. He had sat on the floor by the nursing station and writer secured a wheelchair to assist him back to his room to lie down. He rated his pain at 11 and was in a great deal of pain when writer assisted him back to bed. He yelled out in pain as he attempted to lie on his side but had to roll to his stomach to help relieve the pain. Provider notified and will give order for medication once his chart is reviewed.

## 2023-07-06 NOTE — Plan of Care (Signed)
Nurse discussed coping skills, BP with patient.  Routine check of VS.

## 2023-07-06 NOTE — Progress Notes (Signed)
,  D: Patient's self inventory sheet, patient sleeps good, sleep medication good.  Fair appetite, normal energy level, poor 7 concentration.  Rated depression and anxiety 7, denied hopeless.  Denied withdrawals.  Denied SI.  Physical problems, back, pain medicine helpful.  Goal is anger management.    A:  Medications administered per MD orders.   Emotional support and encouragement given patient. R:  Denied SI and HI.   Denied A/V hallucinations.  Safety maintained with 15 minute checks.

## 2023-07-06 NOTE — Progress Notes (Signed)
   07/06/23 0559  15 Minute Checks  Location Bedroom  Visual Appearance Calm  Behavior Sleeping  Sleep (Behavioral Health Patients Only)  Calculate sleep? (Click Yes once per 24 hr at 0600 safety check) Yes  Documented sleep last 24 hours 7.75

## 2023-07-06 NOTE — Plan of Care (Signed)
  Problem: Education: Goal: Emotional status will improve Outcome: Progressing   

## 2023-07-06 NOTE — Group Note (Signed)
LCSW Group Therapy Note   Group Date: 07/06/2023 Start Time: 1000 End Time: 1100   . Type of Therapy and Topic:  Group Therapy: Embracing Change   Participation Level:  Did Not Attend   Description of Group:   In this group, patients shared and discussed the importance change.  The group discussed how change plays a vial role in life and the decision that are made. A group discussion was facilitated in group to encourage participants to challenge negative thought with positives in their lives.  Therapeutic Goals: Patients will identify how the word "Change" resonates with them  Patients will discuss how they handle change Patients will explore negative and positive emotions related to change. Patients will verbalize what change look like for them/   Summary of Patient Progress:  The patient was invited but did not attend group  Therapeutic Modalities:   Solution-Focused Therapy Activity  Steffanie Dunn , LCSWA  07/06/2023  1:48 PM

## 2023-07-06 NOTE — [facility] Group Notes (Signed)
[facility] Group Notes:  (Nursing)  Date:  07/06/2023  Time:  1345  Type of Therapy:  Psychoeducational Skills  Participation Level:  Did Not Attend    Summary of Progress/Problems:  Tyrone Barnett 07/06/2023, 4:51 PM

## 2023-07-07 DIAGNOSIS — F332 Major depressive disorder, recurrent severe without psychotic features: Secondary | ICD-10-CM | POA: Diagnosis not present

## 2023-07-07 MED ORDER — VENLAFAXINE HCL ER 37.5 MG PO CP24
112.5000 mg | ORAL_CAPSULE | Freq: Every day | ORAL | Status: DC
  Administered 2023-07-08 – 2023-07-09 (×2): 112.5 mg via ORAL
  Filled 2023-07-07 (×4): qty 3

## 2023-07-07 MED ORDER — PREGABALIN 25 MG PO CAPS
25.0000 mg | ORAL_CAPSULE | Freq: Three times a day (TID) | ORAL | Status: DC
  Administered 2023-07-07 – 2023-07-10 (×8): 25 mg via ORAL
  Filled 2023-07-07 (×8): qty 1

## 2023-07-07 MED ORDER — ARIPIPRAZOLE 2 MG PO TABS
2.0000 mg | ORAL_TABLET | Freq: Every day | ORAL | Status: DC
  Administered 2023-07-07 – 2023-07-10 (×4): 2 mg via ORAL
  Filled 2023-07-07 (×6): qty 1

## 2023-07-07 NOTE — Plan of Care (Signed)
Problem: Activity: Goal: Interest or engagement in activities will improve Outcome: Progressing   Problem: Coping: Goal: Ability to verbalize frustrations and anger appropriately will improve Outcome: Progressing   Problem: Safety: Goal: Periods of time without injury will increase Outcome: Progressing  Pt A & O X 4. Denies SI, HI and AVH when assessed. Observed with fair eye contact, flat affect, depressed mood but pleasant on interactions. Rated his pain 7/10 and received PRN Oxycodone and Tylenol 650 mg at intervals this shift (See EMAR). Rated his depression and anxiety both 5/10 with stressors being "Life and my pain".    Tolerated meals, medications and fluids well without discomfort. Off unit unit for meals, returned without issues. Safety checks maintained at Q 15 minutes intervals without incident. Pt denies concerns at this time.

## 2023-07-07 NOTE — [facility] Group Notes (Signed)
[facility] Group Notes:  (Nursing/MHT/Case Management/Adjunct)  Date:  07/07/2023  Time:  8:19 PM  Type of Therapy:   AA group  Participation Level:  Active  Participation Quality:  Appropriate  Affect:  Appropriate  Cognitive:  Appropriate  Insight:  Appropriate  Engagement in Group:  Engaged  Modes of Intervention:  Education  Summary of Progress/Problems:  Tyrone Barnett 07/07/2023, 8:19 PM

## 2023-07-07 NOTE — Progress Notes (Signed)
[facility] MD Progress Note  07/07/2023 3:01 PM Tyrone Barnett  MRN:  [id number]  Subjective:   Patient is a 52 year old male with a psychiatric history of MDD, GAD, ADHD and PTSD, who was admitted to the psychiatric unit for evaluation and treatment of worsening depression and suicidal thoughts.   Yesterday the psychiatry team made the following recommendations: Continue Effexor to 75 mg once daily   On assessment today, the pt reports that their mood is depressed, sad, hopeless, helpless, without significant change since admission.  Reports that anxiety is high. Sleep is poor with difficulty maintaining sleep due to back pain says oxycodone not help and is willing to trial lyrica (stated gabapentin previously not helpful).  Appetite is better. Concentration is better.  Energy level is low. Reports having intense suicidal thoughts that he should no be living and he is better off dead. Denies having any suicidal intent and plan.  Denies having any HI.  Denies having psychotic symptoms.   Denies having side effects to current psychiatric medications.   We discussed changes to current medication regimen, including increasing effexor dose and starting abilify to augment antidepressant. And starting lyrica for back pain.  Discussed the following psychosocial stressors: isolating self from family, he states they do not know he is in the hospital and he does not want to contact them. He is ultimately agreeable to calling them when they get back from a beach trip in 1-2 days.        Principal Problem: MDD (major depressive disorder), recurrent severe, without psychosis (HCC) Diagnosis: Principal Problem:   MDD (major depressive disorder), recurrent severe, without psychosis (HCC) Active Problems:   Hypertension   Severe cocaine use disorder (HCC)   GAD (generalized anxiety disorder)   PTSD (post-traumatic stress disorder)  Total Time spent with patient: 20 minutes  Past Psychiatric History:   MDD, GAD, PTSD, ADHD Patient denies any history of psychiatric hospitalization Patient denies any history of suicide attempts Current psychiatric medications: Prozac Wellbutrin and Strattera Past psychiatric medication trials: Patient reports he has been on multiple psychiatric medications past but does not recall any of the specific names of these medications or if they were effective or not  Past Medical History:  Past Medical History:  Diagnosis Date   Cocaine abuse with cocaine-induced mood disorder (HCC) 01/28/2021   Hypertension    Nephrolithiasis 02/15/2022   Obesity    Palpitations    Renal cell carcinoma (HCC)    Renal disorder    kidney stones   Severe cocaine use disorder (HCC) 01/28/2021   Supraventricular tachycardia 05/30/2011   SVT (supraventricular tachycardia)     Past Surgical History:  Procedure Laterality Date   TONSILLECTOMY     Family History:  Family History  Problem Relation Age of Onset   Hypertension Mother    Lupus Brother    Heart attack Maternal Grandfather        X2   Family Psychiatric  History: See H&P  Social History:  Social History   Substance and Sexual Activity  Alcohol Use Not Currently   Comment: occasionally     Social History   Substance and Sexual Activity  Drug Use Yes   Types: Cocaine    Social History   Socioeconomic History   Marital status: Married    Spouse name: Not on file   Number of children: Not on file   Years of education: Not on file   Highest education level: Not on file  Occupational History  Not on file  Tobacco Use   Smoking status: Never   Smokeless tobacco: Current  Vaping Use   Vaping status: Every Day  Substance and Sexual Activity   Alcohol use: Not Currently    Comment: occasionally   Drug use: Yes    Types: Cocaine   Sexual activity: Not on file  Other Topics Concern   Not on file  Social History Narrative   Not on file   Social Determinants of Health   Financial Resource  Strain: Not on file  Food Insecurity: No Food Insecurity (07/03/2023)   Hunger Vital Sign    Worried About Running Out of Food in the Last Year: Never true    Ran Out of Food in the Last Year: Never true  Transportation Needs: No Transportation Needs (07/03/2023)   PRAPARE - Administrator, Civil Service (Medical): No    Lack of Transportation (Non-Medical): No  Physical Activity: Not on file  Stress: Not on file  Social Connections: Unknown (03/16/2022)   Received from [facility]   Social Network    Social Network: Not on file   Additional Social History:                           Current Medications: Current Facility-Administered Medications  Medication Dose Route Frequency Provider Last Rate Last Admin   acetaminophen (TYLENOL) tablet 650 mg  650 mg Oral Q6H PRN Lenard Lance, FNP   650 mg at 07/06/23 1856   allopurinol (ZYLOPRIM) tablet 300 mg  300 mg Oral Daily Gaile Allmon, Harrold Donath, MD   300 mg at 07/07/23 0833   alum & mag hydroxide-simeth (MAALOX/MYLANTA) 200-200-20 MG/5ML suspension 30 mL  30 mL Oral Q4H PRN Lenard Lance, FNP       amLODipine (NORVASC) tablet 10 mg  10 mg Oral Daily Natassia Guthridge, Harrold Donath, MD   10 mg at 07/07/23 3244   ARIPiprazole (ABILIFY) tablet 2 mg  2 mg Oral Daily Gomer France, Harrold Donath, MD       atenolol (TENORMIN) tablet 100 mg  100 mg Oral Daily Naija Troost, Harrold Donath, MD   100 mg at 07/07/23 0833   benazepril (LOTENSIN) tablet 40 mg  40 mg Oral Daily Meilani Edmundson, Harrold Donath, MD   40 mg at 07/07/23 0102   diphenhydrAMINE (BENADRYL) capsule 50 mg  50 mg Oral TID PRN Lenard Lance, FNP       Or   diphenhydrAMINE (BENADRYL) injection 50 mg  50 mg Intramuscular TID PRN Lenard Lance, FNP       haloperidol (HALDOL) tablet 5 mg  5 mg Oral TID PRN Lenard Lance, FNP       Or   haloperidol lactate (HALDOL) injection 5 mg  5 mg Intramuscular TID PRN Lenard Lance, FNP       hydrOXYzine (ATARAX) tablet 25 mg  25 mg Oral TID PRN Lenard Lance, FNP   25 mg at 07/06/23 1856   LORazepam (ATIVAN) tablet 2 mg  2 mg Oral TID PRN Lenard Lance, FNP       Or   LORazepam (ATIVAN) injection 2 mg  2 mg Intramuscular TID PRN Lenard Lance, FNP       magnesium hydroxide (MILK OF MAGNESIA) suspension 30 mL  30 mL Oral Daily PRN Lenard Lance, FNP       multivitamin with minerals tablet 1 tablet  1 tablet Oral Daily Lenard Lance, FNP  1 tablet at 07/07/23 1696   nicotine (NICODERM CQ - dosed in mg/24 hours) patch 14 mg  14 mg Transdermal Daily Nalini Alcaraz, Harrold Donath, MD   14 mg at 07/06/23 7893   oxyCODONE (Oxy IR/ROXICODONE) immediate release tablet 5 mg  5 mg Oral Q6H PRN Carlisia Geno, Harrold Donath, MD   5 mg at 07/07/23 0836   pantoprazole (PROTONIX) EC tablet 20 mg  20 mg Oral Daily Lenard Lance, FNP   20 mg at 07/07/23 8101   traZODone (DESYREL) tablet 50 mg  50 mg Oral QHS PRN Lenard Lance, FNP       Melene Muller ON 07/08/2023] venlafaxine XR (EFFEXOR-XR) 24 hr capsule 112.5 mg  112.5 mg Oral Q breakfast Clydia Nieves, MD        Lab Results:  Results for orders placed or performed during the hospital encounter of 07/03/23 (from the past 48 hour(s))  Glucose, capillary     Status: Abnormal   Collection Time: 07/06/23  6:26 PM  Result Value Ref Range   Glucose-Capillary 132 (H) 70 - 99 mg/dL    Comment: Glucose reference range applies only to samples taken after fasting for at least 8 hours.    Blood Alcohol level:  Lab Results  Component Value Date   ETH <10 07/03/2023    Metabolic Disorder Labs: Lab Results  Component Value Date   HGBA1C 5.0 12/16/2022   MPG 96.8 12/16/2022   No results found for: "PROLACTIN" Lab Results  Component Value Date   CHOL 197 12/16/2022   TRIG 240 (H) 12/16/2022   HDL 26 (L) 12/16/2022   CHOLHDL 7.6 12/16/2022   VLDL 48 (H) 12/16/2022   LDLCALC 123 (H) 12/16/2022   LDLCALC 80 10/06/2014    Physical Findings: AIMS:  , ,  ,  ,    CIWA:    COWS:     Musculoskeletal: Strength & Muscle Tone:  within normal limits Gait & Station: normal Patient leans: N/A  Psychiatric Specialty Exam:  Presentation  General Appearance:  Disheveled  Eye Contact: Minimal  Speech: Slow  Speech Volume: Decreased  Handedness: Right   Mood and Affect  Mood: Anxious; Depressed  Affect: Congruent; Constricted   Thought Process  Thought Processes: Linear  Descriptions of Associations:Intact  Orientation:Full (Time, Place and Person)  Thought Content:Logical  History of Schizophrenia/Schizoaffective disorder:No  Duration of Psychotic Symptoms:No data recorded Hallucinations:Hallucinations: None  Ideas of Reference:None  Suicidal Thoughts:Suicidal Thoughts: Yes, Passive SI Passive Intent and/or Plan: Without Intent; Without Plan  Homicidal Thoughts:Homicidal Thoughts: No   Sensorium  Memory: Recent Good; Remote Good; Immediate Good  Judgment: Impaired  Insight: Lacking   Executive Functions  Concentration: Fair  Attention Span: Fair  Recall: Good  Fund of Knowledge: Fair  Language: Good   Psychomotor Activity  Psychomotor Activity: Psychomotor Activity: Normal   Assets  Assets: Communication Skills; Desire for Improvement; Financial Resources/Insurance; Resilience; Social Support   Sleep  Sleep: Sleep: Poor    Physical Exam: Physical Exam Vitals reviewed.  Constitutional:      General: He is not in acute distress.    Appearance: He is not toxic-appearing.  Pulmonary:     Effort: Pulmonary effort is normal. No respiratory distress.  Neurological:     Mental Status: He is alert.     Motor: No weakness.     Gait: Gait normal.  Psychiatric:        Behavior: Behavior normal.    Review of Systems  Constitutional:  Negative for chills and fever.  Cardiovascular:  Negative for chest pain and palpitations.  Neurological:  Negative for dizziness, tingling, tremors and headaches.  Psychiatric/Behavioral:  Positive for depression  and suicidal ideas. Negative for hallucinations, memory loss and substance abuse. The patient is nervous/anxious and has insomnia.   All other systems reviewed and are negative.  Blood pressure (!) 141/84, pulse (!) 58, temperature 97.9 F (36.6 C), temperature source Oral, resp. rate 18, height 5\' 10"  (1.778 m), weight 129.3 kg, SpO2 100%. Body mass index is 40.89 kg/m.   Treatment Plan Summary: Daily contact with patient to assess and evaluate symptoms and progress in treatment and Medication management  ASSESSMENT:   Diagnoses / Active Problems: MDD severe recurrent without psychotic features GAD PTSD Stimulant use disorder Cannabis abuse History of alcohol use Rule out opiate use disorder     PLAN: Safety and Monitoring:             --  Voluntary admission to inpatient psychiatric unit for safety, stabilization and treatment             -- Daily contact with patient to assess and evaluate symptoms and progress in treatment             -- Patient's case to be discussed in multi-disciplinary team meeting             -- Observation Level : q15 minute checks             -- Vital signs:  q12 hours             -- Precautions: suicide, elopement, and assault   2. Psychiatric Diagnoses and Treatment:               -Increase Effexor from 75 mg every day to 112.5 mg every day for mdd  -start abilify 2 mg every day to augment snri  -start lyrica 25 mg tid for pain   -Stop Prozac on admission -Stop Wellbutrin on admission  -Hold Strattera for now -Continue hydroxyzine as needed for anxiety and trazodone as needed for insomnia       --  The risks/benefits/side-effects/alternatives to this medication were discussed in detail with the patient and time was given for questions. The patient consents to medication trial.                -- Metabolic profile and EKG monitoring obtained while on an atypical antipsychotic (BMI: Lipid Panel: HbgA1c: QTc:)              -- Encouraged  patient to participate in unit milieu and in scheduled group therapies              -- Short Term Goals: Ability to identify changes in lifestyle to reduce recurrence of condition will improve, Ability to verbalize feelings will improve, Ability to disclose and discuss suicidal ideas, Ability to demonstrate self-control will improve, Ability to identify and develop effective coping behaviors will improve, Ability to maintain clinical measurements within normal limits will improve, Compliance with prescribed medications will improve, and Ability to identify triggers associated with substance abuse/mental health issues will improve             -- Long Term Goals: Improvement in symptoms so as ready for discharge                3. Medical Issues Being Addressed:              Tobacco Use Disorder             --  Nicotine patch 14 mg/24 hours ordered             -- Smoking cessation encouraged   Continue home medications for gout and hypertension     4. Discharge Planning:              -- Social work and case management to assist with discharge planning and identification of hospital follow-up needs prior to discharge             -- Estimated LOS: 5-7 days             -- Discharge Concerns: Need to establish a safety plan; Medication compliance and effectiveness             -- Discharge Goals: Return home with outpatient referrals for mental health follow-up including medication management/psychotherapy     Cristy Hilts, MD 07/07/2023, 3:01 PM  Total Time Spent in Direct Patient Care:  I personally spent 35 minutes on the unit in direct patient care. The direct patient care time included face-to-face time with the patient, reviewing the patient's chart, communicating with other professionals, and coordinating care. Greater than 50% of this time was spent in counseling or coordinating care with the patient regarding goals of hospitalization, psycho-education, and discharge planning needs.    Phineas Inches, MD Psychiatrist

## 2023-07-07 NOTE — Plan of Care (Signed)
   Problem: Education: Goal: Emotional status will improve Outcome: Progressing Goal: Mental status will improve Outcome: Progressing   Problem: Coping: Goal: Ability to demonstrate self-control will improve Outcome: Progressing   

## 2023-07-07 NOTE — Progress Notes (Signed)
   07/07/23 0553  15 Minute Checks  Location Bedroom  Visual Appearance Calm  Behavior Sleeping  Sleep (Behavioral Health Patients Only)  Calculate sleep? (Click Yes once per 24 hr at 0600 safety check) Yes  Documented sleep last 24 hours 7.75

## 2023-07-07 NOTE — Progress Notes (Signed)
   07/07/23 2115  Psych Admission Type (Psych Patients Only)  Admission Status Voluntary  Psychosocial Assessment  Patient Complaints Other (Comment) (back pain)  Eye Contact Brief  Facial Expression Flat  Affect Depressed;Sad  Speech Logical/coherent  Interaction Assertive  Motor Activity Other (Comment) (WDL)  Appearance/Hygiene Unremarkable  Behavior Characteristics Cooperative  Mood Depressed  Thought Process  Coherency WDL  Content WDL  Delusions None reported or observed  Perception WDL  Hallucination None reported or observed  Judgment Poor  Confusion None

## 2023-07-07 NOTE — Group Note (Signed)
Recreation Therapy Group Note   Group Topic:Problem Solving  Group Date: 07/07/2023 Start Time: 0930 End Time: 1000 Facilitators: Karly Pitter-McCall, LRT,CTRS Location: [address] Dayroom   Goal Area(s) Addresses:  Patient will effectively work with peer towards shared goal.  Patient will identify skills used to make activity successful.  Patient will identify how skills used during activity can be applied to reach post d/c goals.   Group Description: [employer]. In teams of 5-6, patients were given 11 craft pipe cleaners. Using the materials provided, patients were instructed to compete again the opposing team(s) to build the tallest free-standing structure from floor level. The activity was timed; difficulty increased by Clinical research associate as Production designer, theatre/television/film continued.  Systematically resources were removed with additional directions for example, placing one arm behind their back, working in silence, and shape stipulations. LRT facilitated post-activity discussion reviewing team processes and necessary communication skills involved in completion. Patients were encouraged to reflect how the skills utilized, or not utilized, in this activity can be incorporated to positively impact support systems post discharge.   Affect/Mood: N/A   Participation Level: Did not attend    Clinical Observations/Individualized Feedback:     Plan: Continue to engage patient in RT group sessions 2-3x/week.   Lynton Crescenzo-McCall, LRT,CTRS 07/07/2023 12:47 PM

## 2023-07-08 DIAGNOSIS — F332 Major depressive disorder, recurrent severe without psychotic features: Secondary | ICD-10-CM | POA: Diagnosis not present

## 2023-07-08 NOTE — Progress Notes (Signed)
   07/08/23 0524  15 Minute Checks  Location Bedroom  Visual Appearance Calm  Behavior Sleeping  Sleep (Behavioral Health Patients Only)  Calculate sleep? (Click Yes once per 24 hr at 0600 safety check) Yes  Documented sleep last 24 hours 8

## 2023-07-08 NOTE — Plan of Care (Signed)
  Problem: Education: Goal: Emotional status will improve Outcome: Progressing Goal: Mental status will improve Outcome: Progressing   Problem: Activity: Goal: Sleeping patterns will improve Outcome: Progressing   

## 2023-07-08 NOTE — Group Note (Signed)
Recreation Therapy Group Note   Group Topic:Animal Assisted Therapy   Group Date: 07/08/2023 Start Time: 0950 End Time: 1030 Facilitators: Darrielle Pflieger-McCall, LRT,CTRS Location: [address] Dayroom   Animal-Assisted Activity (AAA) Program Checklist/Progress Notes Patient Eligibility Criteria Checklist & Daily Group note for Rec Tx Intervention  AAA/T Program Assumption of Risk Form signed by Patient/ or Parent Legal Guardian Yes  Patient is free of allergies or severe asthma Yes  Patient reports no fear of animals Yes  Patient reports no history of cruelty to animals Yes  Patient understands his/her participation is voluntary Yes  Patient washes hands before animal contact Yes  Patient washes hands after animal contact Yes  Education: Hand Washing, Appropriate Animal Interaction   Education Outcome: Acknowledges education.   Clinical Observations/Feedback: Patient attended session and interacted appropriately with therapy dog and peers. Patient asked appropriate questions about therapy dog and his training. Patient shared stories about their pets at home with group.   Jearl Klinefelter, LRT/CTRS    Affect/Mood: Appropriate   Participation Level: Minimal   Participation Quality: Independent   Behavior: Attentive    Speech/Thought Process: Relevant   Insight: Good   Judgement: Good   Modes of Intervention: Teaching laboratory technician   Patient Response to Interventions:  Attentive   Education Outcome:  Acknowledges education   Clinical Observations/Individualized Feedback: Pt was quiet and observant sitting in corner. Pt was attentive throughout group session.    Plan: Continue to engage patient in RT group sessions 2-3x/week.   Teela Narducci-McCall, LRT,CTRS 07/08/2023 12:57 PM

## 2023-07-08 NOTE — Group Note (Signed)
[facility] LCSW Group Therapy Note   Group Date: 07/08/2023 Start Time: 1100 End Time: 1144  Type of Therapy/Topic:  Group Therapy:  Feelings about Diagnosis  Participation Level:  Minimal   Mood: Negative   Description of Group:    This group will allow patients to explore their thoughts and feelings about diagnoses they have received. Patients will be guided to explore their level of understanding and acceptance of these diagnoses. Facilitator will encourage patients to process their thoughts and feelings about the reactions of others to their diagnosis, and will guide patients in identifying ways to discuss their diagnosis with significant others in their lives. This group will be process-oriented, with patients participating in exploration of their own experiences as well as giving and receiving support and challenge from other group members.   Therapeutic Goals: 1. Patient will demonstrate understanding of diagnosis as evidence by identifying two or more symptoms of the disorder:  2. Patient will be able to express two feelings regarding the diagnosis 3. Patient will demonstrate ability to communicate their needs through discussion and/or role plays  Summary of Patient Progress:    Pt displayed disinterest in group discussion    Therapeutic Modalities:   Cognitive Behavioral Therapy Brief Therapy Feelings Identification    Jamarian Jacinto S Lyndsie Wallman, LCSW

## 2023-07-08 NOTE — Progress Notes (Signed)
Pt denied SI/HI/AVH this morning. Pt rated his depression a 5/10, anxiety a 5/10, and feelings of hopelessness a 5/10. Pt presented with an irritable/ flat affect this morning when receiving medications. Pt has remained cooperative throughout the shift. Pt given scheduled medications as prescribed. Q15 min checks verified for safety. Patient verbally contracts for safety. Patient compliant with medications and treatment plan. Patient is interacting well on the unit. Pt is safe on the unit.   07/08/23 0800  Psych Admission Type (Psych Patients Only)  Admission Status Voluntary  Psychosocial Assessment  Patient Complaints Anxiety;Depression  Eye Contact Brief  Facial Expression Flat  Affect Depressed;Anxious  Speech Logical/coherent  Interaction Assertive  Motor Activity Other (Comment) (WDL)  Appearance/Hygiene Unremarkable  Behavior Characteristics Cooperative;Irritable  Mood Depressed;Anxious;Irritable  Thought Process  Coherency WDL  Content WDL  Delusions None reported or observed  Perception WDL  Hallucination None reported or observed  Judgment Poor  Confusion None  Danger to Self  Current suicidal ideation? Denies  Description of Suicide Plan No plan  Self-Injurious Behavior No self-injurious ideation or behavior indicators observed or expressed   Agreement Not to Harm Self Yes  Description of Agreement Verbal  Danger to Others  Danger to Others None reported or observed

## 2023-07-08 NOTE — [facility] Group Notes (Signed)
[facility] Group Notes:  (Nursing/MHT/Case Management/Adjunct)  Date:  07/08/2023  Time:  8:47 PM  Type of Therapy:   Wrap-up group  Participation Level:  Active  Participation Quality:  Appropriate  Affect:  Appropriate  Cognitive:  Appropriate  Insight:  Appropriate  Engagement in Group:  Engaged  Modes of Intervention:  Education  Summary of Progress/Problems: Pt goal to work on anger, pt reports he met goal. Rated his day 6/10.   Noah Delaine 07/08/2023, 8:47 PM

## 2023-07-08 NOTE — Progress Notes (Addendum)
[facility] MD Progress Note  07/08/2023 5:28 PM Tyrone Barnett  MRN:  [id number]  Subjective:   Patient is a 52 year old male with a psychiatric history of MDD, GAD, ADHD and PTSD, who was admitted to the psychiatric unit for evaluation and treatment of worsening depression and suicidal thoughts.    On assessment today, the pt reports that their mood is somewhat less depressed, but is much more tearful during the entirety of the interview.  Reports that anxiety is high. He states that he called his wife yesterday but she did not answer and he plans to call today, to let her know he is in the psychiatric hospital.  Sleep is poor with difficulty maintaining sleep, but pt reports that pain is much less since starting lyrica.  Appetite is better. Concentration is better.  Energy level is low. Reports having intense suicidal thoughts that he should no be living and he is better off dead. Denies having any suicidal intent and plan.  Denies having any HI.  Denies having psychotic symptoms.   Denies having side effects to current psychiatric medications.      Principal Problem: MDD (major depressive disorder), recurrent severe, without psychosis (HCC) Diagnosis: Principal Problem:   MDD (major depressive disorder), recurrent severe, without psychosis (HCC) Active Problems:   Hypertension   Severe cocaine use disorder (HCC)   GAD (generalized anxiety disorder)   PTSD (post-traumatic stress disorder)  Total Time spent with patient: 20 minutes  Past Psychiatric History:  MDD, GAD, PTSD, ADHD Patient denies any history of psychiatric hospitalization Patient denies any history of suicide attempts Current psychiatric medications: Prozac Wellbutrin and Strattera Past psychiatric medication trials: Patient reports he has been on multiple psychiatric medications past but does not recall any of the specific names of these medications or if they were effective or not  Past Medical History:  Past  Medical History:  Diagnosis Date   Cocaine abuse with cocaine-induced mood disorder (HCC) 01/28/2021   Hypertension    Nephrolithiasis 02/15/2022   Obesity    Palpitations    Renal cell carcinoma (HCC)    Renal disorder    kidney stones   Severe cocaine use disorder (HCC) 01/28/2021   Supraventricular tachycardia 05/30/2011   SVT (supraventricular tachycardia)     Past Surgical History:  Procedure Laterality Date   TONSILLECTOMY     Family History:  Family History  Problem Relation Age of Onset   Hypertension Mother    Lupus Brother    Heart attack Maternal Grandfather        X2   Family Psychiatric  History: See H&P  Social History:  Social History   Substance and Sexual Activity  Alcohol Use Not Currently   Comment: occasionally     Social History   Substance and Sexual Activity  Drug Use Yes   Types: Cocaine    Social History   Socioeconomic History   Marital status: Married    Spouse name: Not on file   Number of children: Not on file   Years of education: Not on file   Highest education level: Not on file  Occupational History   Not on file  Tobacco Use   Smoking status: Never   Smokeless tobacco: Current  Vaping Use   Vaping status: Every Day  Substance and Sexual Activity   Alcohol use: Not Currently    Comment: occasionally   Drug use: Yes    Types: Cocaine   Sexual activity: Not on file  Other Topics Concern  Not on file  Social History Narrative   Not on file   Social Determinants of Health   Financial Resource Strain: Not on file  Food Insecurity: No Food Insecurity (07/03/2023)   Hunger Vital Sign    Worried About Running Out of Food in the Last Year: Never true    Ran Out of Food in the Last Year: Never true  Transportation Needs: No Transportation Needs (07/03/2023)   PRAPARE - Administrator, Civil Service (Medical): No    Lack of Transportation (Non-Medical): No  Physical Activity: Not on file  Stress: Not on  file  Social Connections: Unknown (03/16/2022)   Received from [facility]   Social Network    Social Network: Not on file   Additional Social History:                           Current Medications: Current Facility-Administered Medications  Medication Dose Route Frequency Provider Last Rate Last Admin   acetaminophen (TYLENOL) tablet 650 mg  650 mg Oral Q6H PRN Lenard Lance, FNP   650 mg at 07/07/23 1807   allopurinol (ZYLOPRIM) tablet 300 mg  300 mg Oral Daily Vinaya Sancho, Harrold Donath, MD   300 mg at 07/08/23 0742   alum & mag hydroxide-simeth (MAALOX/MYLANTA) 200-200-20 MG/5ML suspension 30 mL  30 mL Oral Q4H PRN Lenard Lance, FNP       amLODipine (NORVASC) tablet 10 mg  10 mg Oral Daily Margeart Allender, Harrold Donath, MD   10 mg at 07/08/23 0741   ARIPiprazole (ABILIFY) tablet 2 mg  2 mg Oral Daily Adriann Ballweg, Harrold Donath, MD   2 mg at 07/08/23 0741   atenolol (TENORMIN) tablet 100 mg  100 mg Oral Daily Terrence Wishon, Harrold Donath, MD   100 mg at 07/08/23 0742   benazepril (LOTENSIN) tablet 40 mg  40 mg Oral Daily Gwenda Heiner, Harrold Donath, MD   40 mg at 07/08/23 0741   diphenhydrAMINE (BENADRYL) capsule 50 mg  50 mg Oral TID PRN Lenard Lance, FNP       Or   diphenhydrAMINE (BENADRYL) injection 50 mg  50 mg Intramuscular TID PRN Lenard Lance, FNP       haloperidol (HALDOL) tablet 5 mg  5 mg Oral TID PRN Lenard Lance, FNP       Or   haloperidol lactate (HALDOL) injection 5 mg  5 mg Intramuscular TID PRN Lenard Lance, FNP       hydrOXYzine (ATARAX) tablet 25 mg  25 mg Oral TID PRN Lenard Lance, FNP   25 mg at 07/06/23 1856   LORazepam (ATIVAN) tablet 2 mg  2 mg Oral TID PRN Lenard Lance, FNP       Or   LORazepam (ATIVAN) injection 2 mg  2 mg Intramuscular TID PRN Lenard Lance, FNP       magnesium hydroxide (MILK OF MAGNESIA) suspension 30 mL  30 mL Oral Daily PRN Lenard Lance, FNP       multivitamin with minerals tablet 1 tablet  1 tablet Oral Daily Lenard Lance, FNP   1 tablet at  07/08/23 0630   nicotine (NICODERM CQ - dosed in mg/24 hours) patch 14 mg  14 mg Transdermal Daily Trelon Plush, Harrold Donath, MD   14 mg at 07/08/23 0745   oxyCODONE (Oxy IR/ROXICODONE) immediate release tablet 5 mg  5 mg Oral Q6H PRN Phineas Inches, MD   5 mg at 07/07/23 2115  pantoprazole (PROTONIX) EC tablet 20 mg  20 mg Oral Daily Lenard Lance, FNP   20 mg at 07/08/23 5366   pregabalin (LYRICA) capsule 25 mg  25 mg Oral TID Phineas Inches, MD   25 mg at 07/08/23 1625   traZODone (DESYREL) tablet 50 mg  50 mg Oral QHS PRN Lenard Lance, FNP       venlafaxine XR (EFFEXOR-XR) 24 hr capsule 112.5 mg  112.5 mg Oral Q breakfast Katelind Pytel, Harrold Donath, MD   112.5 mg at 07/08/23 4403    Lab Results:  Results for orders placed or performed during the hospital encounter of 07/03/23 (from the past 48 hour(s))  Glucose, capillary     Status: Abnormal   Collection Time: 07/06/23  6:26 PM  Result Value Ref Range   Glucose-Capillary 132 (H) 70 - 99 mg/dL    Comment: Glucose reference range applies only to samples taken after fasting for at least 8 hours.    Blood Alcohol level:  Lab Results  Component Value Date   ETH <10 07/03/2023    Metabolic Disorder Labs: Lab Results  Component Value Date   HGBA1C 5.0 12/16/2022   MPG 96.8 12/16/2022   No results found for: "PROLACTIN" Lab Results  Component Value Date   CHOL 197 12/16/2022   TRIG 240 (H) 12/16/2022   HDL 26 (L) 12/16/2022   CHOLHDL 7.6 12/16/2022   VLDL 48 (H) 12/16/2022   LDLCALC 123 (H) 12/16/2022   LDLCALC 80 10/06/2014    Physical Findings: AIMS:  , ,  ,  ,    CIWA:    COWS:     Musculoskeletal: Strength & Muscle Tone: within normal limits Gait & Station: normal Patient leans: N/A  Psychiatric Specialty Exam:  Presentation  General Appearance:  Disheveled  Eye Contact: Minimal  Speech: Slow  Speech Volume: Decreased  Handedness: Right   Mood and Affect  Mood: Anxious;  Depressed  Affect: Congruent; Constricted   Thought Process  Thought Processes: Linear  Descriptions of Associations:Intact  Orientation:Full (Time, Place and Person)  Thought Content:Logical  History of Schizophrenia/Schizoaffective disorder:No  Duration of Psychotic Symptoms:No data recorded Hallucinations:Hallucinations: None  Ideas of Reference:None  Suicidal Thoughts:Suicidal Thoughts: Yes, Passive SI Passive Intent and/or Plan: Without Intent; Without Plan  Homicidal Thoughts:Homicidal Thoughts: No   Sensorium  Memory: Recent Good; Remote Good; Immediate Good  Judgment: Impaired  Insight: Lacking   Executive Functions  Concentration: Fair  Attention Span: Fair  Recall: Good  Fund of Knowledge: Fair  Language: Good   Psychomotor Activity  Psychomotor Activity: Psychomotor Activity: Normal   Assets  Assets: Communication Skills; Desire for Improvement; Financial Resources/Insurance; Resilience; Social Support   Sleep  Sleep: Sleep: Poor    Physical Exam: Physical Exam Vitals reviewed.  Constitutional:      General: He is not in acute distress.    Appearance: He is not toxic-appearing.  Pulmonary:     Effort: Pulmonary effort is normal. No respiratory distress.  Neurological:     Mental Status: He is alert.     Motor: No weakness.     Gait: Gait normal.  Psychiatric:        Behavior: Behavior normal.    Review of Systems  Constitutional:  Negative for chills and fever.  Cardiovascular:  Negative for chest pain and palpitations.  Neurological:  Negative for dizziness, tingling, tremors and headaches.  Psychiatric/Behavioral:  Positive for depression and suicidal ideas. Negative for hallucinations, memory loss and substance abuse. The patient is  nervous/anxious and has insomnia.   All other systems reviewed and are negative.  Blood pressure (!) 140/90, pulse 62, temperature 97.7 F (36.5 C), temperature source Oral,  resp. rate 18, height 5\' 10"  (1.778 m), weight 129.3 kg, SpO2 99%. Body mass index is 40.89 kg/m.   Treatment Plan Summary: Daily contact with patient to assess and evaluate symptoms and progress in treatment and Medication management  ASSESSMENT:   Diagnoses / Active Problems: MDD severe recurrent without psychotic features GAD PTSD Stimulant use disorder Cannabis abuse History of alcohol use Rule out opiate use disorder     PLAN: Safety and Monitoring:             --  Voluntary admission to inpatient psychiatric unit for safety, stabilization and treatment             -- Daily contact with patient to assess and evaluate symptoms and progress in treatment             -- Patient's case to be discussed in multi-disciplinary team meeting             -- Observation Level : q15 minute checks             -- Vital signs:  q12 hours             -- Precautions: suicide, elopement, and assault   2. Psychiatric Diagnoses and Treatment:               -Continue Effexor 112.5 mg every day for mdd  -Continue abilify 2 mg every day to augment snri  -Continue lyrica 25 mg tid for pain   -Stopped Prozac on admission -Stopped Wellbutrin on admission  -Hold Strattera for now -Continue hydroxyzine as needed for anxiety and trazodone as needed for insomnia       --  The risks/benefits/side-effects/alternatives to this medication were discussed in detail with the patient and time was given for questions. The patient consents to medication trial.                -- Metabolic profile and EKG monitoring obtained while on an atypical antipsychotic (BMI: Lipid Panel: HbgA1c: QTc:)              -- Encouraged patient to participate in unit milieu and in scheduled group therapies              -- Short Term Goals: Ability to identify changes in lifestyle to reduce recurrence of condition will improve, Ability to verbalize feelings will improve, Ability to disclose and discuss suicidal ideas, Ability to  demonstrate self-control will improve, Ability to identify and develop effective coping behaviors will improve, Ability to maintain clinical measurements within normal limits will improve, Compliance with prescribed medications will improve, and Ability to identify triggers associated with substance abuse/mental health issues will improve             -- Long Term Goals: Improvement in symptoms so as ready for discharge                3. Medical Issues Being Addressed:              Tobacco Use Disorder             -- Nicotine patch 14 mg/24 hours ordered             -- Smoking cessation encouraged   Continue home medications for gout and hypertension     4. Discharge Planning:              --  Social work and case management to assist with discharge planning and identification of hospital follow-up needs prior to discharge             -- Estimated LOS: 3-4 more days             -- Discharge Concerns: Need to establish a safety plan; Medication compliance and effectiveness             -- Discharge Goals: Return home with outpatient referrals for mental health follow-up including medication management/psychotherapy     Cristy Hilts, MD 07/08/2023, 5:28 PM  Total Time Spent in Direct Patient Care:  I personally spent 25 minutes on the unit in direct patient care. The direct patient care time included face-to-face time with the patient, reviewing the patient's chart, communicating with other professionals, and coordinating care. Greater than 50% of this time was spent in counseling or coordinating care with the patient regarding goals of hospitalization, psycho-education, and discharge planning needs.   Phineas Inches, MD Psychiatrist

## 2023-07-09 ENCOUNTER — Encounter (HOSPITAL_COMMUNITY): Payer: Self-pay

## 2023-07-09 DIAGNOSIS — F332 Major depressive disorder, recurrent severe without psychotic features: Secondary | ICD-10-CM | POA: Diagnosis not present

## 2023-07-09 MED ORDER — VENLAFAXINE HCL ER 150 MG PO CP24
150.0000 mg | ORAL_CAPSULE | Freq: Every day | ORAL | Status: DC
  Administered 2023-07-10: 150 mg via ORAL
  Filled 2023-07-09 (×3): qty 1

## 2023-07-09 NOTE — BHH Counselor (Signed)
CSW spoke with Tyrone Barnett at [facility] and coordinate Transportation scheduled for 07-10-23 between 10-1pm. Discussed with pt who stastes that he plans to be picked up from his home. CSW called Wife Samara Deist to confirm and had to LVM. Encouraged pt to contact [facility] to inform them of the change in plans to which pt agreed. Updated PCP. Will continue to monitor,

## 2023-07-09 NOTE — Progress Notes (Signed)
   07/09/23 0800  Psych Admission Type (Psych Patients Only)  Admission Status Voluntary  Psychosocial Assessment  Patient Complaints Anxiety;Depression  Eye Contact Brief  Facial Expression Flat  Affect Appropriate to circumstance  Speech Logical/coherent  Interaction Assertive  Motor Activity Other (Comment) (WDL)  Appearance/Hygiene Unremarkable  Behavior Characteristics Cooperative;Appropriate to situation  Mood Depressed;Pleasant  Thought Process  Coherency WDL  Content WDL  Delusions None reported or observed  Perception WDL  Hallucination None reported or observed  Judgment Impaired  Confusion None  Danger to Self  Current suicidal ideation? Denies  Description of Suicide Plan No plan  Self-Injurious Behavior No self-injurious ideation or behavior indicators observed or expressed   Agreement Not to Harm Self Yes  Description of Agreement Verbal  Danger to Others  Danger to Others None reported or observed

## 2023-07-09 NOTE — Plan of Care (Signed)
  Problem: Education: Goal: Emotional status will improve Outcome: Progressing Goal: Mental status will improve Outcome: Progressing   Problem: Activity: Goal: Interest or engagement in activities will improve Outcome: Progressing   Problem: Coping: Goal: Ability to demonstrate self-control will improve Outcome: Progressing   

## 2023-07-09 NOTE — Progress Notes (Signed)
   07/08/23 2104  Psych Admission Type (Psych Patients Only)  Admission Status Voluntary  Psychosocial Assessment  Patient Complaints Anxiety;Depression  Eye Contact Brief  Facial Expression Flat  Affect Appropriate to circumstance  Speech Logical/coherent  Interaction Assertive  Motor Activity Other (Comment) (WDL)  Appearance/Hygiene Unremarkable  Behavior Characteristics Cooperative  Mood Pleasant;Depressed  Thought Process  Coherency WDL  Content WDL  Delusions None reported or observed  Perception WDL  Hallucination None reported or observed  Judgment Poor  Confusion None  Danger to Self  Current suicidal ideation? Denies  Agreement Not to Harm Self Yes  Description of Agreement verbal  Danger to Others  Danger to Others None reported or observed

## 2023-07-09 NOTE — [facility] Group Notes (Signed)
Patient attended and participated in the NA group. ?

## 2023-07-09 NOTE — Progress Notes (Signed)
   07/09/23 0530  15 Minute Checks  Location Bedroom  Visual Appearance Calm  Behavior Sleeping  Sleep (Behavioral Health Patients Only)  Calculate sleep? (Click Yes once per 24 hr at 0600 safety check) Yes  Documented sleep last 24 hours 9.75

## 2023-07-09 NOTE — Progress Notes (Signed)
[facility] MD Progress Note  07/09/2023 3:39 PM Tyrone Barnett  MRN:  [id number]  Subjective:   Patient is a 52 year old male with a psychiatric history of MDD, GAD, ADHD and PTSD, who was admitted to the psychiatric unit for evaluation and treatment of worsening depression and suicidal thoughts.    On assessment today, the pt reports that their mood is less depressed, and overall improved since admission.  Reports that anxiety symptoms are less and at manageable level.  Sleep is better. Appetite is stable.  Concentration is without complaint.  Energy level is adequate. Denies having any suicidal thoughts, for the last 24 hours. Denies having any suicidal intent and plan.  Denies having any HI.  Denies having psychotic symptoms.   Denies having side effects to current psychiatric medications.   Discussed discharge planning - [facility] did accept pt and offer to pickup tomorrow. But pt says he needs to first find his car, and gather clothes to go to [place]. States that he can return home with wife, to get car and belongings, then drive himself the [facility].   Per SW, safety planning including securing firearms at home, has already been completed.       Principal Problem: MDD (major depressive disorder), recurrent severe, without psychosis (HCC) Diagnosis: Principal Problem:   MDD (major depressive disorder), recurrent severe, without psychosis (HCC) Active Problems:   Hypertension   Severe cocaine use disorder (HCC)   GAD (generalized anxiety disorder)   PTSD (post-traumatic stress disorder)  Total Time spent with patient: 20 minutes  Past Psychiatric History:  MDD, GAD, PTSD, ADHD Patient denies any history of psychiatric hospitalization Patient denies any history of suicide attempts Current psychiatric medications: Prozac Wellbutrin and Strattera Past psychiatric medication trials: Patient reports he has been on multiple psychiatric medications past but does not recall any of the specific  names of these medications or if they were effective or not  Past Medical History:  Past Medical History:  Diagnosis Date   Cocaine abuse with cocaine-induced mood disorder (HCC) 01/28/2021   Hypertension    Nephrolithiasis 02/15/2022   Obesity    Palpitations    Renal cell carcinoma (HCC)    Renal disorder    kidney stones   Severe cocaine use disorder (HCC) 01/28/2021   Supraventricular tachycardia 05/30/2011   SVT (supraventricular tachycardia)     Past Surgical History:  Procedure Laterality Date   TONSILLECTOMY     Family History:  Family History  Problem Relation Age of Onset   Hypertension Mother    Lupus Brother    Heart attack Maternal Grandfather        X2   Family Psychiatric  History: See H&P  Social History:  Social History   Substance and Sexual Activity  Alcohol Use Not Currently   Comment: occasionally     Social History   Substance and Sexual Activity  Drug Use Yes   Types: Cocaine    Social History   Socioeconomic History   Marital status: Married    Spouse name: Not on file   Number of children: Not on file   Years of education: Not on file   Highest education level: Not on file  Occupational History   Not on file  Tobacco Use   Smoking status: Never   Smokeless tobacco: Current  Vaping Use   Vaping status: Every Day  Substance and Sexual Activity   Alcohol use: Not Currently    Comment: occasionally   Drug use: Yes  Types: Cocaine   Sexual activity: Not on file  Other Topics Concern   Not on file  Social History Narrative   Not on file   Social Determinants of Health   Financial Resource Strain: Not on file  Food Insecurity: No Food Insecurity (07/03/2023)   Hunger Vital Sign    Worried About Running Out of Food in the Last Year: Never true    Ran Out of Food in the Last Year: Never true  Transportation Needs: No Transportation Needs (07/03/2023)   PRAPARE - Administrator, Civil Service (Medical): No     Lack of Transportation (Non-Medical): No  Physical Activity: Not on file  Stress: Not on file  Social Connections: Unknown (03/16/2022)   Received from [facility]   Social Network    Social Network: Not on file   Additional Social History:                           Current Medications: Current Facility-Administered Medications  Medication Dose Route Frequency Provider Last Rate Last Admin   acetaminophen (TYLENOL) tablet 650 mg  650 mg Oral Q6H PRN Lenard Lance, FNP   650 mg at 07/07/23 1807   allopurinol (ZYLOPRIM) tablet 300 mg  300 mg Oral Daily Altair Stanko, Harrold Donath, MD   300 mg at 07/09/23 0738   alum & mag hydroxide-simeth (MAALOX/MYLANTA) 200-200-20 MG/5ML suspension 30 mL  30 mL Oral Q4H PRN Lenard Lance, FNP       amLODipine (NORVASC) tablet 10 mg  10 mg Oral Daily Chinenye Katzenberger, Harrold Donath, MD   10 mg at 07/09/23 1610   ARIPiprazole (ABILIFY) tablet 2 mg  2 mg Oral Daily Rudolph Daoust, Harrold Donath, MD   2 mg at 07/09/23 0737   atenolol (TENORMIN) tablet 100 mg  100 mg Oral Daily Arita Severtson, Harrold Donath, MD   100 mg at 07/09/23 0738   benazepril (LOTENSIN) tablet 40 mg  40 mg Oral Daily Judine Arciniega, Harrold Donath, MD   40 mg at 07/09/23 0737   diphenhydrAMINE (BENADRYL) capsule 50 mg  50 mg Oral TID PRN Lenard Lance, FNP       Or   diphenhydrAMINE (BENADRYL) injection 50 mg  50 mg Intramuscular TID PRN Lenard Lance, FNP       haloperidol (HALDOL) tablet 5 mg  5 mg Oral TID PRN Lenard Lance, FNP       Or   haloperidol lactate (HALDOL) injection 5 mg  5 mg Intramuscular TID PRN Lenard Lance, FNP       hydrOXYzine (ATARAX) tablet 25 mg  25 mg Oral TID PRN Lenard Lance, FNP   25 mg at 07/08/23 2105   LORazepam (ATIVAN) tablet 2 mg  2 mg Oral TID PRN Lenard Lance, FNP       Or   LORazepam (ATIVAN) injection 2 mg  2 mg Intramuscular TID PRN Lenard Lance, FNP       magnesium hydroxide (MILK OF MAGNESIA) suspension 30 mL  30 mL Oral Daily PRN Lenard Lance, FNP        multivitamin with minerals tablet 1 tablet  1 tablet Oral Daily Lenard Lance, FNP   1 tablet at 07/09/23 0737   nicotine (NICODERM CQ - dosed in mg/24 hours) patch 14 mg  14 mg Transdermal Daily Teige Rountree, Harrold Donath, MD   14 mg at 07/09/23 0739   oxyCODONE (Oxy IR/ROXICODONE) immediate release tablet 5 mg  5 mg Oral Q6H PRN Bernie Fobes, Harrold Donath, MD   5 mg at 07/08/23 2104   pantoprazole (PROTONIX) EC tablet 20 mg  20 mg Oral Daily Lenard Lance, FNP   20 mg at 07/09/23 0981   pregabalin (LYRICA) capsule 25 mg  25 mg Oral TID Phineas Inches, MD   25 mg at 07/09/23 1254   traZODone (DESYREL) tablet 50 mg  50 mg Oral QHS PRN Lenard Lance, FNP       Melene Muller ON 07/10/2023] venlafaxine XR (EFFEXOR-XR) 24 hr capsule 150 mg  150 mg Oral Q breakfast Wolfgang Finigan, MD        Lab Results:  No results found for this or any previous visit (from the past 48 hour(s)).   Blood Alcohol level:  Lab Results  Component Value Date   ETH <10 07/03/2023    Metabolic Disorder Labs: Lab Results  Component Value Date   HGBA1C 5.0 12/16/2022   MPG 96.8 12/16/2022   No results found for: "PROLACTIN" Lab Results  Component Value Date   CHOL 197 12/16/2022   TRIG 240 (H) 12/16/2022   HDL 26 (L) 12/16/2022   CHOLHDL 7.6 12/16/2022   VLDL 48 (H) 12/16/2022   LDLCALC 123 (H) 12/16/2022   LDLCALC 80 10/06/2014    Physical Findings: AIMS:  , ,  ,  ,    CIWA:    COWS:     Musculoskeletal: Strength & Muscle Tone: within normal limits Gait & Station: normal Patient leans: N/A  Psychiatric Specialty Exam:  Presentation  General Appearance:  Casual; Fairly Groomed  Eye Contact: Good  Speech: Normal Rate  Speech Volume: Normal  Handedness: Right   Mood and Affect  Mood: Anxious  Affect: Congruent; Full Range   Thought Process  Thought Processes: Linear  Descriptions of Associations:Intact  Orientation:Full (Time, Place and Person)  Thought Content:Logical  History of  Schizophrenia/Schizoaffective disorder:No  Duration of Psychotic Symptoms:No data recorded Hallucinations:Hallucinations: None  Ideas of Reference:None  Suicidal Thoughts:Suicidal Thoughts: No  Homicidal Thoughts:Homicidal Thoughts: No   Sensorium  Memory: Immediate Good; Recent Good; Remote Good  Judgment: Fair  Insight: Fair   Art therapist  Concentration: Fair  Attention Span: Fair  Recall: Good  Fund of Knowledge: Good  Language: Good   Psychomotor Activity  Psychomotor Activity: Psychomotor Activity: Normal   Assets  Assets: Communication Skills; Desire for Improvement; Financial Resources/Insurance; Resilience; Social Support   Sleep  Sleep: Sleep: Fair    Physical Exam: Physical Exam Vitals reviewed.  Constitutional:      General: He is not in acute distress.    Appearance: He is not toxic-appearing.  Pulmonary:     Effort: Pulmonary effort is normal. No respiratory distress.  Neurological:     Mental Status: He is alert.     Motor: No weakness.     Gait: Gait normal.  Psychiatric:        Mood and Affect: Mood normal.        Behavior: Behavior normal.        Thought Content: Thought content normal.        Judgment: Judgment normal.    Review of Systems  Constitutional:  Negative for chills and fever.  Cardiovascular:  Negative for chest pain and palpitations.  Neurological:  Negative for dizziness, tingling, tremors and headaches.  Psychiatric/Behavioral:  Positive for substance abuse. Negative for depression, hallucinations, memory loss and suicidal ideas. The patient is nervous/anxious. The patient does not have insomnia.   All other  systems reviewed and are negative.  Blood pressure (!) 148/85, pulse (!) 58, temperature 98.4 F (36.9 C), temperature source Oral, resp. rate 14, height 5\' 10"  (1.778 m), weight 129.3 kg, SpO2 97%. Body mass index is 40.89 kg/m.   Treatment Plan Summary: Daily contact with patient to  assess and evaluate symptoms and progress in treatment and Medication management  ASSESSMENT:   Diagnoses / Active Problems: MDD severe recurrent without psychotic features GAD PTSD Stimulant use disorder Cannabis abuse History of alcohol use Rule out opiate use disorder     PLAN: Safety and Monitoring:             --  Voluntary admission to inpatient psychiatric unit for safety, stabilization and treatment             -- Daily contact with patient to assess and evaluate symptoms and progress in treatment             -- Patient's case to be discussed in multi-disciplinary team meeting             -- Observation Level : q15 minute checks             -- Vital signs:  q12 hours             -- Precautions: suicide, elopement, and assault   2. Psychiatric Diagnoses and Treatment:               -Increase Effexor from 112.5 mg to 150 mg every day for mdd  -Continue abilify 2 mg every day to augment snri  -Continue lyrica 25 mg tid for pain   -Stopped Prozac on admission -Stopped Wellbutrin on admission  -Hold Strattera for now -Continue hydroxyzine as needed for anxiety and trazodone as needed for insomnia       --  The risks/benefits/side-effects/alternatives to this medication were discussed in detail with the patient and time was given for questions. The patient consents to medication trial.                -- Metabolic profile and EKG monitoring obtained while on an atypical antipsychotic (BMI: Lipid Panel: HbgA1c: QTc:)              -- Encouraged patient to participate in unit milieu and in scheduled group therapies              -- Short Term Goals: Ability to identify changes in lifestyle to reduce recurrence of condition will improve, Ability to verbalize feelings will improve, Ability to disclose and discuss suicidal ideas, Ability to demonstrate self-control will improve, Ability to identify and develop effective coping behaviors will improve, Ability to maintain clinical  measurements within normal limits will improve, Compliance with prescribed medications will improve, and Ability to identify triggers associated with substance abuse/mental health issues will improve             -- Long Term Goals: Improvement in symptoms so as ready for discharge                3. Medical Issues Being Addressed:              Tobacco Use Disorder             -- Nicotine patch 14 mg/24 hours ordered             -- Smoking cessation encouraged   Continue home medications for gout and hypertension     4. Discharge Planning:              --  Social work and case management to assist with discharge planning and identification of hospital follow-up needs prior to discharge             -- Estimated LOS: 1-2 more days             -- Discharge Concerns: Need to establish a safety plan; Medication compliance and effectiveness             -- Discharge Goals: Return home with outpatient referrals for mental health follow-up including medication management/psychotherapy     Cristy Hilts, MD 07/09/2023, 3:39 PM  Total Time Spent in Direct Patient Care:  I personally spent 35 minutes on the unit in direct patient care. The direct patient care time included face-to-face time with the patient, reviewing the patient's chart, communicating with other professionals, and coordinating care. Greater than 50% of this time was spent in counseling or coordinating care with the patient regarding goals of hospitalization, psycho-education, and discharge planning needs.   Phineas Inches, MD Psychiatrist

## 2023-07-09 NOTE — BH IP Treatment Plan (Signed)
Interdisciplinary Treatment and Diagnostic Plan Update  07/09/2023 Time of Session: 1255 Tyrone Barnett MRN: [id number]  Principal Diagnosis: MDD (major depressive disorder), recurrent severe, without psychosis (HCC)  Secondary Diagnoses: Principal Problem:   MDD (major depressive disorder), recurrent severe, without psychosis (HCC) Active Problems:   Hypertension   Severe cocaine use disorder (HCC)   GAD (generalized anxiety disorder)   PTSD (post-traumatic stress disorder)   Current Medications:  Current Facility-Administered Medications  Medication Dose Route Frequency Provider Last Rate Last Admin   acetaminophen (TYLENOL) tablet 650 mg  650 mg Oral Q6H PRN Lenard Lance, FNP   650 mg at 07/07/23 1807   allopurinol (ZYLOPRIM) tablet 300 mg  300 mg Oral Daily Massengill, Harrold Donath, MD   300 mg at 07/09/23 0738   alum & mag hydroxide-simeth (MAALOX/MYLANTA) 200-200-20 MG/5ML suspension 30 mL  30 mL Oral Q4H PRN Lenard Lance, FNP       amLODipine (NORVASC) tablet 10 mg  10 mg Oral Daily Massengill, Harrold Donath, MD   10 mg at 07/09/23 8295   ARIPiprazole (ABILIFY) tablet 2 mg  2 mg Oral Daily Massengill, Harrold Donath, MD   2 mg at 07/09/23 0737   atenolol (TENORMIN) tablet 100 mg  100 mg Oral Daily Massengill, Harrold Donath, MD   100 mg at 07/09/23 0738   benazepril (LOTENSIN) tablet 40 mg  40 mg Oral Daily Massengill, Harrold Donath, MD   40 mg at 07/09/23 0737   diphenhydrAMINE (BENADRYL) capsule 50 mg  50 mg Oral TID PRN Lenard Lance, FNP       Or   diphenhydrAMINE (BENADRYL) injection 50 mg  50 mg Intramuscular TID PRN Lenard Lance, FNP       haloperidol (HALDOL) tablet 5 mg  5 mg Oral TID PRN Lenard Lance, FNP       Or   haloperidol lactate (HALDOL) injection 5 mg  5 mg Intramuscular TID PRN Lenard Lance, FNP       hydrOXYzine (ATARAX) tablet 25 mg  25 mg Oral TID PRN Lenard Lance, FNP   25 mg at 07/08/23 2105   LORazepam (ATIVAN) tablet 2 mg  2 mg Oral TID PRN Lenard Lance, FNP       Or   LORazepam  (ATIVAN) injection 2 mg  2 mg Intramuscular TID PRN Lenard Lance, FNP       magnesium hydroxide (MILK OF MAGNESIA) suspension 30 mL  30 mL Oral Daily PRN Lenard Lance, FNP       multivitamin with minerals tablet 1 tablet  1 tablet Oral Daily Lenard Lance, FNP   1 tablet at 07/09/23 0737   nicotine (NICODERM CQ - dosed in mg/24 hours) patch 14 mg  14 mg Transdermal Daily Massengill, Harrold Donath, MD   14 mg at 07/09/23 0739   oxyCODONE (Oxy IR/ROXICODONE) immediate release tablet 5 mg  5 mg Oral Q6H PRN Massengill, Harrold Donath, MD   5 mg at 07/08/23 2104   pantoprazole (PROTONIX) EC tablet 20 mg  20 mg Oral Daily Lenard Lance, FNP   20 mg at 07/09/23 0738   pregabalin (LYRICA) capsule 25 mg  25 mg Oral TID Phineas Inches, MD   25 mg at 07/09/23 1254   traZODone (DESYREL) tablet 50 mg  50 mg Oral QHS PRN Lenard Lance, FNP       venlafaxine XR (EFFEXOR-XR) 24 hr capsule 112.5 mg  112.5 mg Oral Q breakfast Massengill, Harrold Donath, MD   112.5 mg  at 07/09/23 0738   PTA Medications: Medications Prior to Admission  Medication Sig Dispense Refill Last Dose   allopurinol (ZYLOPRIM) 300 MG tablet Take 300 mg by mouth daily.   07/02/2023   amLODipine-benazepril (LOTREL) 10-40 MG capsule Take 1 capsule by mouth daily. 30 capsule 0 07/02/2023   atenolol (TENORMIN) 100 MG tablet Take 1 tablet (100 mg total) by mouth daily. 30 tablet 0 07/02/2023   atomoxetine (STRATTERA) 80 MG capsule Take 80 mg by mouth daily.   07/02/2023   buPROPion (WELLBUTRIN XL) 150 MG 24 hr tablet Take 1 tablet (150 mg total) by mouth daily. 30 tablet 0 07/02/2023   FLUoxetine (PROZAC) 40 MG capsule Take 1 capsule (40 mg total) by mouth daily. 30 capsule 0 07/02/2023   oxyCODONE (OXY IR/ROXICODONE) 5 MG immediate release tablet Take 5 mg by mouth every 6 (six) hours as needed for severe pain.   Past Week   pantoprazole (PROTONIX) 20 MG tablet Take 1 tablet (20 mg total) by mouth daily. 30 tablet 0 07/02/2023   hydrOXYzine (ATARAX) 25 MG tablet Take 1  tablet (25 mg total) by mouth 3 (three) times daily as needed for anxiety (for anxiety). (Patient not taking: Reported on 07/03/2023) 30 tablet 0 Not Taking   traZODone (DESYREL) 50 MG tablet Take 1 tablet (50 mg total) by mouth at bedtime as needed for sleep (for insomnia). (Patient not taking: Reported on 07/03/2023) 30 tablet 0 Not Taking    Patient Stressors: Financial difficulties   Marital or family conflict   Occupational concerns   Substance abuse    Patient Strengths: Average or above average intelligence   Treatment Modalities: Medication Management, Group therapy, Case management,  1 to 1 session with clinician, Psychoeducation, Recreational therapy.   Physician Treatment Plan for Primary Diagnosis: MDD (major depressive disorder), recurrent severe, without psychosis (HCC) Long Term Goal(s): Improvement in symptoms so as ready for discharge   Short Term Goals: Ability to identify changes in lifestyle to reduce recurrence of condition will improve Ability to verbalize feelings will improve Ability to disclose and discuss suicidal ideas Ability to demonstrate self-control will improve Ability to identify and develop effective coping behaviors will improve Ability to maintain clinical measurements within normal limits will improve Compliance with prescribed medications will improve Ability to identify triggers associated with substance abuse/mental health issues will improve  Medication Management: Evaluate patient's response, side effects, and tolerance of medication regimen.  Therapeutic Interventions: 1 to 1 sessions, Unit Group sessions and Medication administration.  Evaluation of Outcomes: Progressing  Physician Treatment Plan for Secondary Diagnosis: Principal Problem:   MDD (major depressive disorder), recurrent severe, without psychosis (HCC) Active Problems:   Hypertension   Severe cocaine use disorder (HCC)   GAD (generalized anxiety disorder)   PTSD  (post-traumatic stress disorder)  Long Term Goal(s): Improvement in symptoms so as ready for discharge   Short Term Goals: Ability to identify changes in lifestyle to reduce recurrence of condition will improve Ability to verbalize feelings will improve Ability to disclose and discuss suicidal ideas Ability to demonstrate self-control will improve Ability to identify and develop effective coping behaviors will improve Ability to maintain clinical measurements within normal limits will improve Compliance with prescribed medications will improve Ability to identify triggers associated with substance abuse/mental health issues will improve     Medication Management: Evaluate patient's response, side effects, and tolerance of medication regimen.  Therapeutic Interventions: 1 to 1 sessions, Unit Group sessions and Medication administration.  Evaluation of Outcomes: Progressing  RN Treatment Plan for Primary Diagnosis: MDD (major depressive disorder), recurrent severe, without psychosis (HCC) Long Term Goal(s): Knowledge of disease and therapeutic regimen to maintain health will improve  Short Term Goals: Ability to remain free from injury will improve, Ability to verbalize frustration and anger appropriately will improve, Ability to demonstrate self-control, Ability to participate in decision making will improve, Ability to verbalize feelings will improve, Ability to disclose and discuss suicidal ideas, Ability to identify and develop effective coping behaviors will improve, and Compliance with prescribed medications will improve  Medication Management: RN will administer medications as ordered by provider, will assess and evaluate patient's response and provide education to patient for prescribed medication. RN will report any adverse and/or side effects to prescribing provider.  Therapeutic Interventions: 1 on 1 counseling sessions, Psychoeducation, Medication administration, Evaluate  responses to treatment, Monitor vital signs and CBGs as ordered, Perform/monitor CIWA, COWS, AIMS and Fall Risk screenings as ordered, Perform wound care treatments as ordered.  Evaluation of Outcomes: Progressing   LCSW Treatment Plan for Primary Diagnosis: MDD (major depressive disorder), recurrent severe, without psychosis (HCC) Long Term Goal(s): Safe transition to appropriate next level of care at discharge, Engage patient in therapeutic group addressing interpersonal concerns.  Short Term Goals: Engage patient in aftercare planning with referrals and resources, Increase social support, Increase ability to appropriately verbalize feelings, Increase emotional regulation, Facilitate acceptance of mental health diagnosis and concerns, Facilitate patient progression through stages of change regarding substance use diagnoses and concerns, Identify triggers associated with mental health/substance abuse issues, and Increase skills for wellness and recovery  Therapeutic Interventions: Assess for all discharge needs, 1 to 1 time with Social worker, Explore available resources and support systems, Assess for adequacy in community support network, Educate family and significant other(s) on suicide prevention, Complete Psychosocial Assessment, Interpersonal group therapy.  Evaluation of Outcomes: Progressing   Progress in Treatment: Attending groups: Yes. Participating in groups: No. Taking medication as prescribed: Yes. Toleration medication: Yes. Family/Significant other contact made: Yes, individual(s) contacted:  Spouse Patient understands diagnosis: Yes. Discussing patient identified problems/goals with staff: Yes. Medical problems stabilized or resolved: Yes. Denies suicidal/homicidal ideation: Yes. Issues/concerns per patient self-inventory: Yes. Other: N/A  New problem(s) identified: No, Describe:  None reported  New Short Term/Long Term Goal(s):   Patient Goals:    Discharge Plan  or Barriers:   Reason for Continuation of Hospitalization: Anxiety Depression Medication stabilization Withdrawal symptoms  Estimated Length of Stay:  Last 3 [place] Suicide Severity Risk Score: Flowsheet Row Ad[facility] (Current[facility] Most recent reading at 07/03/2023  3:00 PM ED from 07/03/2023 in [facility] Emergency Department at [facility] Most recent reading at 07/03/2023  7:00 AM ED from 12/13/2022 in [facility] Most recent reading at 12/13/2022  6:09 PM  C-SSRS RISK CATEGORY High Risk High Risk Low Risk       Last PHQ 2/9 Scores:    12/16/2022   10:48 AM 12/13/2022    2:49 PM 10/06/2014    9:43 AM  Depression screen PHQ 2/9  Decreased Interest 1 1 0  Down, Depressed, Hopeless 1 1 0  PHQ - 2 Score 2 2 0  Altered sleeping  1   Tired, decreased energy  1   Change in appetite  0   Feeling bad or failure about yourself   1   Trouble concentrating  1   Moving slowly or fidgety/restless  1   Suicidal thoughts  0   PHQ-9 Score  7   Difficult doing work/chores  Very difficult    detox, medication management for mood stabilization; elimination of SI thoughts; development of comprehensive mental wellness/sobriety plan    Scribe for Treatment Team: Ane Payment, LCSW 07/09/2023 3:16 PM

## 2023-07-09 NOTE — Plan of Care (Signed)
  Problem: Education: Goal: Emotional status will improve Outcome: Progressing Goal: Mental status will improve Outcome: Progressing   Problem: Activity: Goal: Interest or engagement in activities will improve Outcome: Progressing Goal: Sleeping patterns will improve Outcome: Progressing

## 2023-07-09 NOTE — Group Note (Signed)
Recreation Therapy Group Note   Group Topic:Other  Group Date: 07/09/2023 Start Time: 1406 End Time: 1448 Facilitators: Alaena Strader-McCall, LRT,CTRS Location: [address] Dayroom   Activity Description/Intervention: Therapeutic Drumming. Patients with peers and staff were given the opportunity to engage in a leader facilitated [facility] Empowerment Drumming Circle with staff from the [employer], in partnership with [employer]. Teaching laboratory technician and trained [employer], Theodoro Doing leading with LRT observing and documenting intervention and pt response. This evidenced-based practice targets 7 areas of health and wellbeing in the human experience including: stress-reduction, exercise, self-expression, camaraderie/support, nurturing, spirituality, and music-making (leisure).   Goal Area(s) Addresses:  Patient will engage in pro-social way in music group.  Patient will follow directions of drum leader on the first prompt. Patient will demonstrate no behavioral issues during group.  Patient will identify if a reduction in stress level occurs as a result of participation in therapeutic drum circle.    Education: Leisure exposure, Pharmacologist, Musical expression, Discharge Planning   Affect/Mood: Appropriate   Participation Level: Engaged   Participation Quality: Independent   Behavior: Appropriate   Speech/Thought Process: Focused   Insight: Good   Judgement: Good   Modes of Intervention: Teaching laboratory technician   Patient Response to Interventions:  Engaged   Education Outcome:  In group clarification offered    Clinical Observations/Individualized Feedback: Tyrone Barnett actively engaged in therapeutic drumming exercise and discussions. Pt was appropriate with peers, staff, and musical equipment for duration of programming.  Pt identified "content" as their feeling after participation in music-based programming. Pt affect congruent/incongruent with  verbalized emotion.     Plan: Continue to engage patient in RT group sessions 2-3x/week.   Tyrone Barnett, LRT,CTRS 07/09/2023 4:10 PM

## 2023-07-09 NOTE — [facility] Group Notes (Signed)
Spiritual care group on grief and loss facilitated by Chaplain Dyanne Carrel, B[facility]  Group Goal: Support / Education around grief and loss  Members engage in facilitated group support and psycho-social education.  Group Description:  Following introductions and group rules, group members engaged in facilitated group dialogue and support around topic of loss, with particular support around experiences of loss in their lives. Group Identified types of loss (relationships / self / things) and identified patterns, circumstances, and changes that precipitate losses. Reflected on thoughts / feelings around loss, normalized grief responses, and recognized variety in grief experience. Group encouraged individual reflection on safe space and on the coping skills that they are already utilizing.  Group drew on Adlerian / Rogerian and narrative framework  Patient Progress: Tyrone Barnett attended group and actively participated in group conversation and activities.  He shared about some of his grief responses and how painful they have been.

## 2023-07-10 DIAGNOSIS — F332 Major depressive disorder, recurrent severe without psychotic features: Secondary | ICD-10-CM | POA: Diagnosis not present

## 2023-07-10 MED ORDER — VENLAFAXINE HCL ER 150 MG PO CP24: 150.0000 mg | ORAL_CAPSULE | Freq: Every day | ORAL | 0 refills | Status: AC

## 2023-07-10 MED ORDER — PANTOPRAZOLE SODIUM 20 MG PO TBEC: 20.0000 mg | DELAYED_RELEASE_TABLET | Freq: Every day | ORAL | 0 refills | Status: AC

## 2023-07-10 MED ORDER — ATENOLOL 100 MG PO TABS: 100.0000 mg | ORAL_TABLET | Freq: Every day | ORAL | 0 refills | Status: AC

## 2023-07-10 MED ORDER — AMLODIPINE BESY-BENAZEPRIL HCL 10-40 MG PO CAPS: 1.0000 | ORAL_CAPSULE | Freq: Every day | ORAL | 0 refills | Status: AC

## 2023-07-10 MED ORDER — NICOTINE 14 MG/24HR TD PT24: 14.0000 mg | MEDICATED_PATCH | Freq: Every day | TRANSDERMAL | 0 refills | Status: AC

## 2023-07-10 MED ORDER — PREGABALIN 25 MG PO CAPS: 25.0000 mg | ORAL_CAPSULE | Freq: Three times a day (TID) | ORAL | 0 refills | Status: AC

## 2023-07-10 MED ORDER — ARIPIPRAZOLE 2 MG PO TABS: 2.0000 mg | ORAL_TABLET | Freq: Every day | ORAL | 0 refills | Status: AC

## 2023-07-10 MED ORDER — ADULT MULTIVITAMIN W/MINERALS CH: 1.0000 | ORAL_TABLET | Freq: Every day | ORAL | Status: AC

## 2023-07-10 MED ORDER — HYDROXYZINE HCL 25 MG PO TABS: 25.0000 mg | ORAL_TABLET | Freq: Three times a day (TID) | ORAL | 0 refills | Status: AC | PRN

## 2023-07-10 MED ORDER — ALLOPURINOL 300 MG PO TABS: 300.0000 mg | ORAL_TABLET | Freq: Every day | ORAL | 0 refills | Status: AC

## 2023-07-10 MED ORDER — TRAZODONE HCL 50 MG PO TABS: 50.0000 mg | ORAL_TABLET | Freq: Every evening | ORAL | 0 refills | Status: AC | PRN

## 2023-07-10 NOTE — Progress Notes (Signed)
Note addendum:  Pt. Is scheduled to be discharged to [facility] today. Pt. Cooperative, no SI, HI, or hallucinations. Does not appear to be harmful to self or others. No verbal complaints, noted to be interacting well with others.

## 2023-07-10 NOTE — Discharge Summary (Signed)
Physician Discharge Summary Note  Patient:  Tyrone Barnett is an 52 y.o., male MRN:  [id number] DOB:  [birth year] Patient phone:  [phone] (home)  Patient address:   [address] [place] [address],  Total Time spent with patient: 20 minutes  Date of Admission:  07/03/2023 Date of Discharge: 07-10-2023  Reason for Admission:   Patient is a 52 year old male with a psychiatric history of MDD, GAD, ADHD and PTSD, who was admitted to the psychiatric unit for evaluation and treatment of worsening depression and suicidal thoughts.    Principal Problem: MDD (major depressive disorder), recurrent severe, without psychosis (HCC) Discharge Diagnoses: Principal Problem:   MDD (major depressive disorder), recurrent severe, without psychosis (HCC) Active Problems:   Hypertension   Severe cocaine use disorder (HCC)   GAD (generalized anxiety disorder)   PTSD (post-traumatic stress disorder)   Past Psychiatric History:  MDD, GAD, PTSD, ADHD Patient denies any history of psychiatric hospitalization Patient denies any history of suicide attempts Current psychiatric medications: Prozac Wellbutrin and Strattera Past psychiatric medication trials: Patient reports he has been on multiple psychiatric medications past but does not recall any of the specific names of these medications or if they were effective or not   Past Medical History:  Past Medical History:  Diagnosis Date   Cocaine abuse with cocaine-induced mood disorder (HCC) 01/28/2021   Hypertension    Nephrolithiasis 02/15/2022   Obesity    Palpitations    Renal cell carcinoma (HCC)    Renal disorder    kidney stones   Severe cocaine use disorder (HCC) 01/28/2021   Supraventricular tachycardia 05/30/2011   SVT (supraventricular tachycardia)     Past Surgical History:  Procedure Laterality Date   TONSILLECTOMY     Family History:  Family History  Problem Relation Age of Onset   Hypertension Mother    Lupus  Brother    Heart attack Maternal Grandfather        X2   Family Psychiatric  History: See H&P   Social History:  Social History   Substance and Sexual Activity  Alcohol Use Not Currently   Comment: occasionally     Social History   Substance and Sexual Activity  Drug Use Yes   Types: Cocaine    Social History   Socioeconomic History   Marital status: Married    Spouse name: Not on file   Number of children: Not on file   Years of education: Not on file   Highest education level: Not on file  Occupational History   Not on file  Tobacco Use   Smoking status: Never   Smokeless tobacco: Current  Vaping Use   Vaping status: Every Day  Substance and Sexual Activity   Alcohol use: Not Currently    Comment: occasionally   Drug use: Yes    Types: Cocaine   Sexual activity: Not on file  Other Topics Concern   Not on file  Social History Narrative   Not on file   Social Determinants of Health   Financial Resource Strain: Not on file  Food Insecurity: No Food Insecurity (07/03/2023)   Hunger Vital Sign    Worried About Running Out of Food in the Last Year: Never true    Ran Out of Food in the Last Year: Never true  Transportation Needs: No Transportation Needs (07/03/2023)   PRAPARE - Administrator, Civil Service (Medical): No    Lack of Transportation (Non-Medical): No  Physical Activity: Not on  file  Stress: Not on file  Social Connections: Unknown (03/16/2022)   [facility] from [facility]   Social Network    Social Network: Not on file    Hospital Course:   During the patient's hospitalization, patient had extensive initial psychiatric evaluation, and follow-up psychiatric evaluations every day.  Psychiatric diagnoses provided upon initial assessment:  MDD severe recurrent without psychotic features GAD PTSD Stimulant use disorder Cannabis abuse History of alcohol use Rule out opiate use disorder  Patient's psychiatric  medications were adjusted on admission:  -Stop Prozac -Stop Wellbutrin -Hold Strattera for now -Start Effexor 37.5 mg once daily today, and increased to 75 mg once daily tomorrow -Consider starting Abilify in the future to augment antidepressant -Start hydroxyzine as needed for anxiety and trazodone as needed for insomnia    During the hospitalization, other adjustments were made to the patient's psychiatric medication regimen:  -effexor was incr to 150 mg every day -abilify 2 mg every day was started to augment snri -lyrica 25 mg tid was started for pain  Patient's care was discussed during the interdisciplinary team meeting every day during the hospitalization.  The patient denied having side effects to prescribed psychiatric medication.  Gradually, patient started adjusting to milieu. The patient was evaluated each day by a clinical provider to ascertain response to treatment. Improvement was noted by the patient's report of decreasing symptoms, improved sleep and appetite, affect, medication tolerance, behavior, and participation in unit programming.  Patient was asked each day to complete a self inventory noting mood, mental status, pain, new symptoms, anxiety and concerns.    Symptoms were reported as significantly decreased or resolved completely by discharge.   On day of discharge, the patient reports that their mood is stable. The patient denied having suicidal thoughts for more than 48 hours prior to discharge.  Patient denies having homicidal thoughts.  Patient denies having auditory hallucinations.  Patient denies any visual hallucinations or other symptoms of psychosis. The patient was motivated to continue taking medication with a goal of continued improvement in mental health.   The patient reports their target psychiatric symptoms of depression and suicidal thoughts, all responded well to the psychiatric medications, and the patient reports overall benefit other psychiatric  hospitalization. Supportive psychotherapy was provided to the patient. The patient also participated in regular group therapy while hospitalized. Coping skills, problem solving as well as relaxation therapies were also part of the unit programming.  Labs were reviewed with the patient, and abnormal results were discussed with the patient.  The patient is able to verbalize their individual safety plan to this provider.  # It is recommended to the patient to continue psychiatric medications as prescribed, after discharge from the hospital.    # It is recommended to the patient to follow up with your outpatient psychiatric provider and PCP.  # It was discussed with the patient, the impact of alcohol, drugs, tobacco have been there overall psychiatric and medical wellbeing, and total abstinence from substance use was recommended the patient.ed.  # Prescriptions provided or sent directly to preferred pharmacy at discharge. Patient agreeable to plan. Given opportunity to ask questions. Appears to feel comfortable with discharge.    # In the event of worsening symptoms, the patient is instructed to call the crisis hotline, 911 and or go to the nearest ED for appropriate evaluation and treatment of symptoms. To follow-up with primary care provider for other medical issues, concerns and or health care needs  # Patient was  discharged to [facility] treatment center (he will drive self there),  with a plan to follow up as noted below. Per SW, safety planning was done with wife, guns are secured, pt not allowed to return home.    Physical Findings: AIMS:  , ,  ,  ,    CIWA:    COWS:     Aims score zero on my exam. No eps on my exam.   Musculoskeletal: Strength & Muscle Tone: within normal limits Gait & Station: normal Patient leans: N/A   Psychiatric Specialty Exam:  Presentation  General Appearance:  Appropriate for Environment; Casual; Fairly Groomed  Eye Contact: Good  Speech: Normal  Rate; Clear and Coherent  Speech Volume: Normal  Handedness: Right   Mood and Affect  Mood: Euthymic; Anxious  Affect: Appropriate; Congruent; Full Range   Thought Process  Thought Processes: Linear  Descriptions of Associations:Intact  Orientation:Full (Time, Place and Person)  Thought Content:Logical  History of Schizophrenia/Schizoaffective disorder:No  Duration of Psychotic Symptoms:No data recorded Hallucinations:Hallucinations: None  Ideas of Reference:None  Suicidal Thoughts:Suicidal Thoughts: No  Homicidal Thoughts:Homicidal Thoughts: No   Sensorium  Memory: Immediate Good; Recent Good; Remote Good  Judgment: Fair  Insight: Fair   Art therapist  Concentration: Good  Attention Span: Good  Recall: Good  Fund of Knowledge: Good  Language: Good   Psychomotor Activity  Psychomotor Activity: Psychomotor Activity: Normal   Assets  Assets: Communication Skills; Desire for Improvement; Financial Resources/Insurance; Resilience; Social Support   Sleep  Sleep: Sleep: Fair    Physical Exam: Physical Exam Vitals reviewed.  Constitutional:      General: He is not in acute distress.    Appearance: He is not toxic-appearing.  Pulmonary:     Effort: Pulmonary effort is normal. No respiratory distress.  Neurological:     Mental Status: He is alert.     Motor: No weakness.     Gait: Gait normal.  Psychiatric:        Mood and Affect: Mood normal.        Behavior: Behavior normal.        Thought Content: Thought content normal.        Judgment: Judgment normal.    Review of Systems  Constitutional:  Negative for chills and fever.  Cardiovascular:  Negative for chest pain and palpitations.  Musculoskeletal:  Positive for back pain.  Neurological:  Negative for dizziness, tingling, tremors and headaches.  Psychiatric/Behavioral:  Positive for substance abuse. Negative for depression, hallucinations, memory loss and  suicidal ideas. The patient is not nervous/anxious and does not have insomnia.   All other systems reviewed and are negative.  Blood pressure (!) 149/89, pulse (!) 58, temperature 98.2 F (36.8 C), temperature source Oral, resp. rate 12, height 5\' 10"  (1.778 m), weight 129.3 kg, SpO2 99%. Body mass index is 40.89 kg/m.   Social History   Tobacco Use  Smoking Status Never  Smokeless Tobacco Current   Tobacco Cessation:  N/A, patient does not currently use tobacco products   Blood Alcohol level:  Lab Results  Component Value Date   ETH <10 07/03/2023    Metabolic Disorder Labs:  Lab Results  Component Value Date   HGBA1C 5.0 12/16/2022   MPG 96.8 12/16/2022   No results found for: "PROLACTIN" Lab Results  Component Value Date   CHOL 197 12/16/2022   TRIG 240 (H) 12/16/2022   HDL 26 (L) 12/16/2022   CHOLHDL 7.6 12/16/2022   VLDL 48 (H) 12/16/2022  LDLCALC 123 (H) 12/16/2022   LDLCALC 80 10/06/2014    See Psychiatric Specialty Exam and Suicide Risk Assessment completed by Attending Physician prior to discharge.  Discharge destination:  Other:  [facility]  Is patient on multiple antipsychotic therapies at discharge:  No   Has Patient had three or more failed trials of antipsychotic monotherapy by history:  No  Recommended Plan for Multiple Antipsychotic Therapies: NA  Discharge Instructions     Diet - low sodium heart healthy   Complete by: As directed    Increase activity slowly   Complete by: As directed       Allergies as of 07/10/2023       Reactions   Coconut Fatty Acids Anaphylaxis, Hives   Sulfa Antibiotics Hives, Rash        Medication List     STOP taking these medications    atomoxetine 80 MG capsule Commonly known as: STRATTERA   buPROPion 150 MG 24 hr tablet Commonly known as: WELLBUTRIN XL   FLUoxetine 40 MG capsule Commonly known as: PROZAC       TAKE these medications      Indication  allopurinol 300 MG tablet Commonly known  as: ZYLOPRIM Take 1 tablet (300 mg total) by mouth daily.  Indication: Gout   amLODipine-benazepril 10-40 MG capsule Commonly known as: LOTREL Take 1 capsule by mouth daily.  Indication: High Blood Pressure Disorder   ARIPiprazole 2 MG tablet Commonly known as: ABILIFY Take 1 tablet (2 mg total) by mouth daily. Start taking on: July 11, 2023  Indication: Major Depressive Disorder   atenolol 100 MG tablet Commonly known as: TENORMIN Take 1 tablet (100 mg total) by mouth daily.  Indication: High Blood Pressure Disorder   hydrOXYzine 25 MG tablet Commonly known as: ATARAX Take 1 tablet (25 mg total) by mouth 3 (three) times daily as needed for anxiety (for anxiety).  Indication: Feeling Anxious   multivitamin with minerals Tabs tablet Take 1 tablet by mouth daily.  Indication: vitamin   nicotine 14 mg/24hr patch Commonly known as: NICODERM CQ - dosed in mg/24 hours Place 1 patch (14 mg total) onto the skin daily. Start taking on: July 11, 2023  Indication: Nicotine Addiction   oxyCODONE 5 MG immediate release tablet Commonly known as: Oxy IR/ROXICODONE Take 5 mg by mouth every 6 (six) hours as needed for severe pain.  Indication: Acute Pain   pantoprazole 20 MG tablet Commonly known as: PROTONIX Take 1 tablet (20 mg total) by mouth daily.  Indication: Heartburn   pregabalin 25 MG capsule Commonly known as: LYRICA Take 1 capsule (25 mg total) by mouth 3 (three) times daily.  Indication: Neuropathic Pain   traZODone 50 MG tablet Commonly known as: DESYREL Take 1 tablet (50 mg total) by mouth at bedtime as needed for sleep (for insomnia).  Indication: Trouble Sleeping   venlafaxine XR 150 MG 24 hr capsule Commonly known as: EFFEXOR-XR Take 1 capsule (150 mg total) by mouth daily with breakfast. Start taking on: July 11, 2023  Indication: Major Depressive Disorder        Follow-up Information     [facility]. Schedule an appointment as soon as  possible for a visit.   Why: You may call this provider to schedule a hospital follow up appointment, if you wish to obtain therapy and/or medication management services. Contact information: [address]  [address] [place] [address] [phone]  Follow-up recommendations:    Activity: as tolerated  Diet: heart healthy  Other: -Follow-up with your outpatient psychiatric provider -instructions on appointment date, time, and address (location) are provided to you in discharge paperwork.  -Take your psychiatric medications as prescribed at discharge - instructions are provided to you in the discharge paperwork  -Follow-up with outpatient primary care doctor and other specialists -for management of preventative medicine and chronic medical disease  -If you are prescribed an atypical antipsychotic medication, we recommend that your outpatient psychiatrist follow routine screening for side effects within 3 months of discharge, including monitoring: AIMS scale, height, weight, blood pressure, fasting lipid panel, HbA1c, and fasting blood sugar.   -Recommend total abstinence from alcohol, tobacco, and other illicit drug use at discharge.   -If your psychiatric symptoms recur, worsen, or if you have side effects to your psychiatric medications, call your outpatient psychiatric provider, 911, [phone] or go to the nearest emergency department.  -If suicidal thoughts occur, immediately call your outpatient psychiatric provider, 911, 988 or go to the nearest emergency department.   Signed: Cristy Hilts, MD 07/10/2023, 9:42 AM  Total Time Spent in Direct Patient Care:  I personally spent 35 minutes on the unit in direct patient care. The direct patient care time included face-to-face time with the patient, reviewing the patient's chart, communicating with other professionals, and coordinating care. Greater than 50% of this time was spent in counseling or  coordinating care with the patient regarding goals of hospitalization, psycho-education, and discharge planning needs.   Phineas Inches, MD Psychiatrist

## 2023-07-10 NOTE — Progress Notes (Signed)
   07/10/23 1000  Psych Admission Type (Psych Patients Only)  Admission Status Voluntary  Psychosocial Assessment  Patient Complaints Anxiety  Eye Contact Brief  Facial Expression Flat  Affect Irritable  Speech Logical/coherent  Interaction Assertive  Motor Activity Restless;Pacing  Appearance/Hygiene Unremarkable  Behavior Characteristics Cooperative  Thought Process  Coherency WDL  Content WDL  Delusions None reported or observed  Perception WDL  Hallucination None reported or observed  Judgment Limited  Confusion None  Danger to Self  Current suicidal ideation? Plan  Description of Suicide Plan No Plan  Agreement Not to Harm Self Yes  Description of Agreement Verbal  Danger to Others  Danger to Others None reported or observed

## 2023-07-10 NOTE — Discharge Instructions (Signed)
-  Follow-up with your outpatient psychiatric provider -instructions on appointment date, time, and address (location) are provided to you in discharge paperwork.  -Take your psychiatric medications as prescribed at discharge - instructions are provided to you in the discharge paperwork You will get printed Rx that you can have filled at [facility].   -Follow-up with outpatient primary care doctor and other specialists -for management of preventative medicine and any chronic medical disease.  -Recommend abstinence from alcohol, tobacco, and other illicit drug use at discharge.   -If your psychiatric symptoms recur, worsen, or if you have side effects to your psychiatric medications, call your outpatient psychiatric provider, 911, 988 or go to the nearest emergency department.  -If suicidal thoughts occur, call your outpatient psychiatric provider, 911, 988 or go to the nearest emergency department.  Naloxone (Narcan) can help reverse an overdose when given to the victim quickly.  [facility] offers free naloxone kits and instructions/training on its use.  Add naloxone to your first aid kit and you can help save a life.   Pick up your free kit at the following locations:   [place]:  [facility], [address] [place] [address] [phone]) [facility] [address] [place] [address] [phone]) [facility] [address] [place] [address]  [place]: [facility] [address] [place] [address] ([phone]) [facility] [address] [place] [address] [phone])

## 2023-07-10 NOTE — [facility] Suicide Risk Assessment (Signed)
[facility] Discharge Suicide Risk Assessment   Principal Problem: MDD (major depressive disorder), recurrent severe, without psychosis (HCC) Discharge Diagnoses: Principal Problem:   MDD (major depressive disorder), recurrent severe, without psychosis (HCC) Active Problems:   Hypertension   Severe cocaine use disorder (HCC)   GAD (generalized anxiety disorder)   PTSD (post-traumatic stress disorder)   Total Time spent with patient: 20 minutes  Patient is a 52 year old male with a psychiatric history of MDD, GAD, ADHD and PTSD, who was admitted to the psychiatric unit for evaluation and treatment of worsening depression and suicidal thoughts.     During the patient's hospitalization, patient had extensive initial psychiatric evaluation, and follow-up psychiatric evaluations every day.   Psychiatric diagnoses provided upon initial assessment:  MDD severe recurrent without psychotic features GAD PTSD Stimulant use disorder Cannabis abuse History of alcohol use Rule out opiate use disorder   Patient's psychiatric medications were adjusted on admission:  -Stop Prozac -Stop Wellbutrin -Hold Strattera for now -Start Effexor 37.5 mg once daily today, and increased to 75 mg once daily tomorrow -Consider starting Abilify in the future to augment antidepressant -Start hydroxyzine as needed for anxiety and trazodone as needed for insomnia     During the hospitalization, other adjustments were made to the patient's psychiatric medication regimen:  -effexor was incr to 150 mg every day -abilify 2 mg every day was started to augment snri -lyrica 25 mg tid was started for pain   Patient's care was discussed during the interdisciplinary team meeting every day during the hospitalization.   The patient denied having side effects to prescribed psychiatric medication.   Gradually, patient started adjusting to milieu. The patient was evaluated each day by a clinical provider to ascertain response to  treatment. Improvement was noted by the patient's report of decreasing symptoms, improved sleep and appetite, affect, medication tolerance, behavior, and participation in unit programming.  Patient was asked each day to complete a self inventory noting mood, mental status, pain, new symptoms, anxiety and concerns.     Symptoms were reported as significantly decreased or resolved completely by discharge.    On day of discharge, the patient reports that their mood is stable. The patient denied having suicidal thoughts for more than 48 hours prior to discharge.  Patient denies having homicidal thoughts.  Patient denies having auditory hallucinations.  Patient denies any visual hallucinations or other symptoms of psychosis. The patient was motivated to continue taking medication with a goal of continued improvement in mental health.    The patient reports their target psychiatric symptoms of depression and suicidal thoughts, all responded well to the psychiatric medications, and the patient reports overall benefit other psychiatric hospitalization. Supportive psychotherapy was provided to the patient. The patient also participated in regular group therapy while hospitalized. Coping skills, problem solving as well as relaxation therapies were also part of the unit programming.   Labs were reviewed with the patient, and abnormal results were discussed with the patient.   The patient is able to verbalize their individual safety plan to this provider.   # It is recommended to the patient to continue psychiatric medications as prescribed, after discharge from the hospital.     # It is recommended to the patient to follow up with your outpatient psychiatric provider and PCP.   # It was discussed with the patient, the impact of alcohol, drugs, tobacco have been there overall psychiatric and medical wellbeing, and total abstinence from substance use was recommended the patient.ed.   #  Prescriptions provided or  sent directly to preferred pharmacy at discharge. Patient agreeable to plan. Given opportunity to ask questions. Appears to feel comfortable with discharge.    # In the event of worsening symptoms, the patient is instructed to call the crisis hotline, 911 and or go to the nearest ED for appropriate evaluation and treatment of symptoms. To follow-up with primary care provider for other medical issues, concerns and or health care needs   # Patient was discharged to [facility] treatment center (he will drive self there),  with a plan to follow up as noted below. Per SW, safety planning was done with wife, guns are secured, pt not allowed to return home.         Psychiatric Specialty Exam  Presentation  General Appearance:  Appropriate for Environment; Casual; Fairly Groomed  Eye Contact: Good  Speech: Normal Rate; Clear and Coherent  Speech Volume: Normal  Handedness: Right   Mood and Affect  Mood: Euthymic; Anxious  Duration of Depression Symptoms: Greater than two weeks  Affect: Appropriate; Congruent; Full Range   Thought Process  Thought Processes: Linear  Descriptions of Associations:Intact  Orientation:Full (Time, Place and Person)  Thought Content:Logical  History of Schizophrenia/Schizoaffective disorder:No  Duration of Psychotic Symptoms:No data recorded Hallucinations:Hallucinations: None  Ideas of Reference:None  Suicidal Thoughts:Suicidal Thoughts: No  Homicidal Thoughts:Homicidal Thoughts: No   Sensorium  Memory: Immediate Good; Recent Good; Remote Good  Judgment: Fair  Insight: Fair   Art therapist  Concentration: Good  Attention Span: Good  Recall: Good  Fund of Knowledge: Good  Language: Good   Psychomotor Activity  Psychomotor Activity: Psychomotor Activity: Normal   Assets  Assets: Communication Skills; Desire for Improvement; Financial Resources/Insurance; Resilience; Social Support   Sleep   Sleep: Sleep: Fair   Physical Exam: Physical Exam See discharge summary  ROS See discharge summary   Blood pressure (!) 149/89, pulse (!) 58, temperature 98.2 F (36.8 C), temperature source Oral, resp. rate 12, height 5\' 10"  (1.778 m), weight 129.3 kg, SpO2 99%. Body mass index is 40.89 kg/m.  Mental Status Per Nursing Assessment::   On Admission:  Suicidal ideation indicated by patient  Demographic factors:  Male, Unemployed Loss Factors:  Loss of significant relationship Historical Factors:  Impulsivity Risk Reduction Factors:  Responsible for children under 17 years of age  Continued Clinical Symptoms:  Mood is stable, denying SI.   Cognitive Features That Contribute To Risk:  None    Suicide Risk:  Mild:  There are no identifiable suicide plans, no associated intent, mild dysphoria and related symptoms, good self-control (both objective and subjective assessment), few other risk factors, and identifiable protective factors, including available and accessible social support.    Follow-up Information     [facility]. Schedule an appointment as soon as possible for a visit.   Why: You may call this provider to schedule a hospital follow up appointment, if you wish to obtain therapy and/or medication management services. Contact information: [address]  [address] [place] [address] [phone]                 Plan Of Care/Follow-up recommendations:   -Follow-up with your outpatient psychiatric provider -instructions on appointment date, time, and address (location) are provided to you in discharge paperwork.   -Take your psychiatric medications as prescribed at discharge - instructions are provided to you in the discharge paperwork   -Follow-up with outpatient primary care doctor and other specialists -for management of preventative medicine and chronic  medical disease   -If you are prescribed an atypical antipsychotic medication, we recommend that  your outpatient psychiatrist follow routine screening for side effects within 3 months of discharge, including monitoring: AIMS scale, height, weight, blood pressure, fasting lipid panel, HbA1c, and fasting blood sugar.    -Recommend total abstinence from alcohol, tobacco, and other illicit drug use at discharge.    -If your psychiatric symptoms recur, worsen, or if you have side effects to your psychiatric medications, call your outpatient psychiatric provider, 911, [phone] or go to the nearest emergency department.   -If suicidal thoughts occur, immediately call your outpatient psychiatric provider, 911, [phone] or go to the nearest emergency department.    Cristy Hilts, MD 07/10/2023, 9:54 AM

## 2023-07-15 ENCOUNTER — Emergency Department (HOSPITAL_COMMUNITY)
Admission: EM | Admit: 2023-07-15 | Discharge: 2023-07-15 | Disposition: A | Discharge status: Home / Self Care | Payer: BC Managed Care – PPO

## 2023-07-15 ENCOUNTER — Other Ambulatory Visit: Payer: Self-pay

## 2023-07-15 DIAGNOSIS — M545 Low back pain, unspecified: Secondary | ICD-10-CM | POA: Diagnosis not present

## 2023-07-15 DIAGNOSIS — I951 Orthostatic hypotension: Secondary | ICD-10-CM

## 2023-07-15 DIAGNOSIS — R42 Dizziness and giddiness: Secondary | ICD-10-CM | POA: Diagnosis not present

## 2023-07-15 DIAGNOSIS — Z5900 Homelessness unspecified: Secondary | ICD-10-CM | POA: Insufficient documentation

## 2023-07-15 DIAGNOSIS — R000 Tachycardia, unspecified: Secondary | ICD-10-CM | POA: Diagnosis not present

## 2023-07-15 DIAGNOSIS — M5441 Lumbago with sciatica, right side: Secondary | ICD-10-CM | POA: Diagnosis not present

## 2023-07-15 DIAGNOSIS — R531 Weakness: Secondary | ICD-10-CM | POA: Insufficient documentation

## 2023-07-15 DIAGNOSIS — R55 Syncope and collapse: Secondary | ICD-10-CM | POA: Diagnosis not present

## 2023-07-15 DIAGNOSIS — I10 Essential (primary) hypertension: Secondary | ICD-10-CM | POA: Diagnosis not present

## 2023-07-15 DIAGNOSIS — G8929 Other chronic pain: Secondary | ICD-10-CM | POA: Insufficient documentation

## 2023-07-15 LAB — BASIC METABOLIC PANEL
Anion gap: 6 (ref 5–15)
BUN: 5 mg/dL — ABNORMAL LOW (ref 6–20)
CO2: 29 mmol/L (ref 22–32)
Calcium: 8.9 mg/dL (ref 8.9–10.3)
Chloride: 100 mmol/L (ref 98–111)
Creatinine, Ser: 0.84 mg/dL (ref 0.61–1.24)
GFR, Estimated: >60 mL/min (ref >60)
Glucose, Bld: 131 mg/dL — ABNORMAL HIGH (ref 70–99)
Potassium: 3.1 mmol/L — ABNORMAL LOW (ref 3.5–5.1)
Sodium: 135 mmol/L (ref 135–145)

## 2023-07-15 LAB — URINALYSIS, ROUTINE W REFLEX MICROSCOPIC
Bilirubin Urine: NEGATIVE
Glucose, UA: NEGATIVE mg/dL
Hgb urine dipstick: NEGATIVE
Ketones, ur: NEGATIVE mg/dL
Leukocytes,Ua: NEGATIVE
Nitrite: NEGATIVE
Protein, ur: NEGATIVE mg/dL
Specific Gravity, Urine: 1.004 — ABNORMAL LOW (ref 1.005–1.030)
pH: 7 (ref 5.0–8.0)

## 2023-07-15 LAB — CBC
HCT: 39.2 % (ref 39.0–52.0)
Hemoglobin: 13.3 g/dL (ref 13.0–17.0)
MCH: 30.6 pg (ref 26.0–34.0)
MCHC: 33.9 g/dL (ref 30.0–36.0)
MCV: 90.3 fL (ref 80.0–100.0)
Platelets: 190 10*3/uL (ref 150–400)
RBC: 4.34 MIL/uL (ref 4.22–5.81)
RDW: 13.2 % (ref 11.5–15.5)
WBC: 8.1 10*3/uL (ref 4.0–10.5)
nRBC: 0 % (ref 0.0–0.2)

## 2023-07-15 LAB — CBG MONITORING, ED: Glucose-Capillary: 138 mg/dL — ABNORMAL HIGH (ref 70–99)

## 2023-07-15 MED ORDER — ACETAMINOPHEN 325 MG PO TABS
650.0000 mg | ORAL_TABLET | Freq: Once | ORAL | Status: AC
  Administered 2023-07-15: 650 mg via ORAL
  Filled 2023-07-15: qty 2

## 2023-07-15 MED ORDER — OXYCODONE-ACETAMINOPHEN 5-325 MG PO TABS
1.0000 | ORAL_TABLET | Freq: Once | ORAL | Status: AC
  Administered 2023-07-15: 1 via ORAL
  Filled 2023-07-15: qty 1

## 2023-07-15 MED ORDER — POTASSIUM CHLORIDE CRYS ER 20 MEQ PO TBCR
20.0000 meq | EXTENDED_RELEASE_TABLET | Freq: Once | ORAL | Status: AC
  Administered 2023-07-15: 20 meq via ORAL
  Filled 2023-07-15: qty 1

## 2023-07-15 MED ORDER — LACTATED RINGERS IV BOLUS
1000.0000 mL | Freq: Once | INTRAVENOUS | Status: AC
  Administered 2023-07-15: 1000 mL via INTRAVENOUS

## 2023-07-15 MED ORDER — LIDOCAINE 5 % EX PTCH
1.0000 | MEDICATED_PATCH | Freq: Once | CUTANEOUS | Status: DC
  Administered 2023-07-15: 1 via TRANSDERMAL
  Filled 2023-07-15: qty 1

## 2023-07-15 NOTE — ED Provider Notes (Signed)
[place] EMERGENCY DEPARTMENT AT [facility] Provider Note   CSN: [id number] Arrival date & time: 07/15/23  1605     History  Chief Complaint  Patient presents with   Weakness    Tyrone Barnett is a 52 y.o. male.  52 year old male present emergency department with multiple complaints.  He is here for acute on chronic back pain as well as male syncope.  Patient reports that he is homeless and living in his car.  Decreased p.o. intake.  This afternoon he got out of his car and took a few steps felt lightheaded and lowered himself to the ground where he states that he blacked out.  No chest pain shortness of breath or arrhythmia prior to.  Did not hit head.  He is also complaining of back pain states that he has had it for some time.  No fevers, lower extremity weakness, saddle anesthesia or bowel or bladder incontinence.   Weakness      Home Medications Prior to Admission medications   Medication Sig Start Date End Date Taking? Authorizing Provider  allopurinol (ZYLOPRIM) 300 MG tablet Take 1 tablet (300 mg total) by mouth daily. 07/10/23   Massengill, Harrold Donath, MD  amLODipine-benazepril (LOTREL) 10-40 MG capsule Take 1 capsule by mouth daily. 07/10/23 08/09/23  Massengill, Harrold Donath, MD  ARIPiprazole (ABILIFY) 2 MG tablet Take 1 tablet (2 mg total) by mouth daily. 07/11/23   Massengill, Harrold Donath, MD  atenolol (TENORMIN) 100 MG tablet Take 1 tablet (100 mg total) by mouth daily. 07/10/23 08/09/23  Massengill, Harrold Donath, MD  hydrOXYzine (ATARAX) 25 MG tablet Take 1 tablet (25 mg total) by mouth 3 (three) times daily as needed for anxiety (for anxiety). 07/10/23   Massengill, Harrold Donath, MD  Multiple Vitamin (MULTIVITAMIN WITH MINERALS) TABS tablet Take 1 tablet by mouth daily. 07/10/23   Massengill, Harrold Donath, MD  nicotine (NICODERM CQ - DOSED IN MG/24 HOURS) 14 mg/24hr patch Place 1 patch (14 mg total) onto the skin daily. 07/11/23   Massengill, Harrold Donath, MD  oxyCODONE (OXY IR/ROXICODONE) 5 MG immediate  release tablet Take 5 mg by mouth every 6 (six) hours as needed for severe pain. 03/11/23   [provider]  pantoprazole (PROTONIX) 20 MG tablet Take 1 tablet (20 mg total) by mouth daily. 07/10/23   Massengill, Harrold Donath, MD  pregabalin (LYRICA) 25 MG capsule Take 1 capsule (25 mg total) by mouth 3 (three) times daily. 07/10/23 08/09/23  Massengill, Harrold Donath, MD  traZODone (DESYREL) 50 MG tablet Take 1 tablet (50 mg total) by mouth at bedtime as needed for sleep (for insomnia). 07/10/23   Massengill, Harrold Donath, MD  venlafaxine XR (EFFEXOR-XR) 150 MG 24 hr capsule Take 1 capsule (150 mg total) by mouth daily with breakfast. 07/11/23   Massengill, Harrold Donath, MD      Allergies    Coconut fatty acids and Sulfa antibiotics    Review of Systems   Review of Systems  Neurological:  Positive for weakness.    Physical Exam Updated Vital Signs BP (!) 179/80 (BP Location: Left Arm)   Pulse 90   Temp 99.5 F (37.5 C) (Oral)   Resp 18   Ht 5\' 10"  (1.778 m)   Wt 127 kg   SpO2 97%   BMI 40.17 kg/m  Physical Exam Vitals and nursing note reviewed.  Constitutional:      General: He is not in acute distress.    Appearance: He is not toxic-appearing.  HENT:     Nose: Nose normal.  Mouth/Throat:     Mouth: Mucous membranes are moist.  Eyes:     Conjunctiva/sclera: Conjunctivae normal.  Cardiovascular:     Rate and Rhythm: Normal rate and regular rhythm.  Pulmonary:     Effort: Pulmonary effort is normal.     Breath sounds: Normal breath sounds.  Abdominal:     General: Abdomen is flat. There is no distension.     Palpations: Abdomen is soft.     Tenderness: There is no abdominal tenderness. There is no guarding or rebound.  Musculoskeletal:        General: Normal range of motion.  Skin:    General: Skin is warm and dry.     Capillary Refill: Capillary refill takes less than 2 seconds.  Neurological:     General: No focal deficit present.     Mental Status: He is alert.  Psychiatric:         Mood and Affect: Mood normal.        Behavior: Behavior normal.     ED Results / Procedures / Treatments   Labs (all labs ordered are listed, but only abnormal results are displayed) Labs Reviewed  BASIC METABOLIC PANEL - Abnormal; Notable for the following components:      Result Value   Potassium 3.1 (*)    Glucose, Bld 131 (*)    BUN 5 (*)    All other components within normal limits  URINALYSIS, ROUTINE W REFLEX MICROSCOPIC - Abnormal; Notable for the following components:   Color, Urine STRAW (*)    Specific Gravity, Urine 1.004 (*)    All other components within normal limits  CBG MONITORING, ED - Abnormal; Notable for the following components:   Glucose-Capillary 138 (*)    All other components within normal limits  CBC    EKG EKG Interpretation Date/Time:  Tuesday July 15 2023 18:59:59 EDT Ventricular Rate:  93 PR Interval:  169 QRS Duration:  87 QT Interval:  455 QTC Calculation: 566 R Axis:   16  Text Interpretation: Sinus rhythm Prolonged QT interval Confirmed by Estanislado Pandy [phone]) on 07/15/2023 7:21:35 PM  Radiology No results found.  Procedures Procedures    Medications Ordered in ED Medications  lidocaine (LIDODERM) 5 % 1 patch (1 patch Transdermal Patch Applied 07/15/23 1954)  potassium chloride SA (KLOR-CON M) CR tablet 20 mEq (20 mEq Oral Given 07/15/23 1954)  lactated ringers bolus 1,000 mL (1,000 mLs Intravenous Bolus 07/15/23 2005)  oxyCODONE-acetaminophen (PERCOCET/ROXICET) 5-325 MG per tablet 1 tablet (1 tablet Oral Given 07/15/23 1953)  acetaminophen (TYLENOL) tablet 650 mg (650 mg Oral Given 07/15/23 1953)    ED Course/ Medical Decision Making/ A&P                                 Medical Decision Making Is a 52 year old male presenting emergency department back pain and what sounds like a orthostatic syncopal event.  He is afebrile vital signs reassuring.  Physical exam without red flag findings for his back pain.  No indication for  imaging at this time.  Labs ordered by triage with no significant findings other than some mildly low potassium.  He has no AKI.  He has no fever or tachycardia or leukocytosis to suggest systemic infection.  No evidence of UTI on UA.  Patient treated with multimodal pain medications with improvement of symptoms.  Was given IV fluids.  Orthostatics were negative.  EKG without arrhythmia  or ST segment changes to indicate ischemia.  No chest pain or shortness of breath, low suspicion for ACS.  Patient presentation.  Syncopal event likely secondary to decreased p.o. intake from his homelessness.  He was given improvement here.  Ambulatory in department.  He reports that he is going to rehab tomorrow.  Will discharge stable condition.  Amount and/or Complexity of Data Reviewed External Data Reviewed:     Details: Was here last week for depression. Labs: ordered.  Risk OTC drugs. Prescription drug management. Diagnosis or treatment significantly limited by social determinants of health. Risk Details: Homeless          Final Clinical Impression(s) / ED Diagnoses Final diagnoses:  None    Rx / DC Orders ED Discharge Orders     None         Coral Spikes, DO 07/15/23 2134

## 2023-07-15 NOTE — ED Triage Notes (Signed)
Pt BIB EMS c/o weakness, dizziness pt states has been living in his car for the past few days and has been limited on food and fluids. Pt is supposed to go to rehab tomorrow in [place], last used meth 2 days ago.  180/100 120 hr 134 cbg

## 2023-07-15 NOTE — ED Notes (Signed)
I gave my patient a sandwich and so cheese with a coke per MD

## 2023-07-16 DIAGNOSIS — F1420 Cocaine dependence, uncomplicated: Secondary | ICD-10-CM | POA: Diagnosis not present

## 2023-07-17 DIAGNOSIS — F1420 Cocaine dependence, uncomplicated: Secondary | ICD-10-CM | POA: Diagnosis not present

## 2023-07-28 DIAGNOSIS — F1021 Alcohol dependence, in remission: Secondary | ICD-10-CM | POA: Diagnosis not present

## 2023-07-28 DIAGNOSIS — F1220 Cannabis dependence, uncomplicated: Secondary | ICD-10-CM | POA: Diagnosis not present

## 2023-07-28 DIAGNOSIS — F1420 Cocaine dependence, uncomplicated: Secondary | ICD-10-CM | POA: Diagnosis not present

## 2023-07-28 DIAGNOSIS — F339 Major depressive disorder, recurrent, unspecified: Secondary | ICD-10-CM | POA: Diagnosis not present

## 2023-08-07 DIAGNOSIS — F1021 Alcohol dependence, in remission: Secondary | ICD-10-CM | POA: Diagnosis not present

## 2023-08-07 DIAGNOSIS — F339 Major depressive disorder, recurrent, unspecified: Secondary | ICD-10-CM | POA: Diagnosis not present

## 2023-08-07 DIAGNOSIS — F1420 Cocaine dependence, uncomplicated: Secondary | ICD-10-CM | POA: Diagnosis not present

## 2023-08-07 DIAGNOSIS — F1220 Cannabis dependence, uncomplicated: Secondary | ICD-10-CM | POA: Diagnosis not present

## 2023-08-08 DIAGNOSIS — F1220 Cannabis dependence, uncomplicated: Secondary | ICD-10-CM | POA: Diagnosis not present

## 2023-08-08 DIAGNOSIS — F1420 Cocaine dependence, uncomplicated: Secondary | ICD-10-CM | POA: Diagnosis not present

## 2023-08-09 DIAGNOSIS — F1220 Cannabis dependence, uncomplicated: Secondary | ICD-10-CM | POA: Diagnosis not present

## 2023-08-09 DIAGNOSIS — F1420 Cocaine dependence, uncomplicated: Secondary | ICD-10-CM | POA: Diagnosis not present

## 2023-08-10 DIAGNOSIS — F1220 Cannabis dependence, uncomplicated: Secondary | ICD-10-CM | POA: Diagnosis not present

## 2023-08-10 DIAGNOSIS — F1420 Cocaine dependence, uncomplicated: Secondary | ICD-10-CM | POA: Diagnosis not present

## 2023-08-11 DIAGNOSIS — F1420 Cocaine dependence, uncomplicated: Secondary | ICD-10-CM | POA: Diagnosis not present

## 2023-08-11 DIAGNOSIS — F1220 Cannabis dependence, uncomplicated: Secondary | ICD-10-CM | POA: Diagnosis not present

## 2023-08-12 DIAGNOSIS — F1420 Cocaine dependence, uncomplicated: Secondary | ICD-10-CM | POA: Diagnosis not present

## 2023-08-12 DIAGNOSIS — F1220 Cannabis dependence, uncomplicated: Secondary | ICD-10-CM | POA: Diagnosis not present

## 2023-08-13 DIAGNOSIS — F1420 Cocaine dependence, uncomplicated: Secondary | ICD-10-CM | POA: Diagnosis not present

## 2023-08-13 DIAGNOSIS — Z79899 Other long term (current) drug therapy: Secondary | ICD-10-CM | POA: Diagnosis not present

## 2023-08-13 DIAGNOSIS — F1220 Cannabis dependence, uncomplicated: Secondary | ICD-10-CM | POA: Diagnosis not present

## 2023-08-14 DIAGNOSIS — F1220 Cannabis dependence, uncomplicated: Secondary | ICD-10-CM | POA: Diagnosis not present

## 2023-08-14 DIAGNOSIS — F1420 Cocaine dependence, uncomplicated: Secondary | ICD-10-CM | POA: Diagnosis not present

## 2023-08-15 DIAGNOSIS — R040 Epistaxis: Secondary | ICD-10-CM | POA: Diagnosis not present

## 2023-08-15 DIAGNOSIS — Z20822 Contact with and (suspected) exposure to covid-19: Secondary | ICD-10-CM | POA: Diagnosis not present

## 2023-08-15 DIAGNOSIS — F1220 Cannabis dependence, uncomplicated: Secondary | ICD-10-CM | POA: Diagnosis not present

## 2023-08-15 DIAGNOSIS — R0981 Nasal congestion: Secondary | ICD-10-CM | POA: Diagnosis not present

## 2023-08-15 DIAGNOSIS — Z79899 Other long term (current) drug therapy: Secondary | ICD-10-CM | POA: Diagnosis not present

## 2023-08-15 DIAGNOSIS — M7918 Myalgia, other site: Secondary | ICD-10-CM | POA: Diagnosis not present

## 2023-08-15 DIAGNOSIS — B349 Viral infection, unspecified: Secondary | ICD-10-CM | POA: Diagnosis not present

## 2023-08-15 DIAGNOSIS — R059 Cough, unspecified: Secondary | ICD-10-CM | POA: Diagnosis not present

## 2023-08-15 DIAGNOSIS — F1420 Cocaine dependence, uncomplicated: Secondary | ICD-10-CM | POA: Diagnosis not present

## 2023-08-15 DIAGNOSIS — Z87891 Personal history of nicotine dependence: Secondary | ICD-10-CM | POA: Diagnosis not present

## 2023-08-16 DIAGNOSIS — F1220 Cannabis dependence, uncomplicated: Secondary | ICD-10-CM | POA: Diagnosis not present

## 2023-08-16 DIAGNOSIS — F1420 Cocaine dependence, uncomplicated: Secondary | ICD-10-CM | POA: Diagnosis not present

## 2023-08-17 DIAGNOSIS — F1220 Cannabis dependence, uncomplicated: Secondary | ICD-10-CM | POA: Diagnosis not present

## 2023-08-17 DIAGNOSIS — F1420 Cocaine dependence, uncomplicated: Secondary | ICD-10-CM | POA: Diagnosis not present

## 2023-08-18 DIAGNOSIS — M79632 Pain in left forearm: Secondary | ICD-10-CM | POA: Diagnosis not present

## 2023-08-18 DIAGNOSIS — R208 Other disturbances of skin sensation: Secondary | ICD-10-CM | POA: Diagnosis not present

## 2023-08-18 DIAGNOSIS — X58XXXA Exposure to other specified factors, initial encounter: Secondary | ICD-10-CM | POA: Diagnosis not present

## 2023-08-18 DIAGNOSIS — F1220 Cannabis dependence, uncomplicated: Secondary | ICD-10-CM | POA: Diagnosis not present

## 2023-08-18 DIAGNOSIS — L539 Erythematous condition, unspecified: Secondary | ICD-10-CM | POA: Diagnosis not present

## 2023-08-18 DIAGNOSIS — F411 Generalized anxiety disorder: Secondary | ICD-10-CM | POA: Diagnosis not present

## 2023-08-18 DIAGNOSIS — S51852A Open bite of left forearm, initial encounter: Secondary | ICD-10-CM | POA: Diagnosis not present

## 2023-08-18 DIAGNOSIS — F32A Depression, unspecified: Secondary | ICD-10-CM | POA: Diagnosis not present

## 2023-08-18 DIAGNOSIS — L03114 Cellulitis of left upper limb: Secondary | ICD-10-CM | POA: Diagnosis not present

## 2023-08-18 DIAGNOSIS — F1420 Cocaine dependence, uncomplicated: Secondary | ICD-10-CM | POA: Diagnosis not present

## 2023-08-18 DIAGNOSIS — Z87891 Personal history of nicotine dependence: Secondary | ICD-10-CM | POA: Diagnosis not present

## 2023-08-19 DIAGNOSIS — L03114 Cellulitis of left upper limb: Secondary | ICD-10-CM | POA: Diagnosis not present

## 2023-08-19 DIAGNOSIS — F1420 Cocaine dependence, uncomplicated: Secondary | ICD-10-CM | POA: Diagnosis not present

## 2023-08-19 DIAGNOSIS — L539 Erythematous condition, unspecified: Secondary | ICD-10-CM | POA: Diagnosis not present

## 2023-08-19 DIAGNOSIS — Z79899 Other long term (current) drug therapy: Secondary | ICD-10-CM | POA: Diagnosis not present

## 2023-08-19 DIAGNOSIS — M79632 Pain in left forearm: Secondary | ICD-10-CM | POA: Diagnosis not present

## 2023-08-19 DIAGNOSIS — Z882 Allergy status to sulfonamides status: Secondary | ICD-10-CM | POA: Diagnosis not present

## 2023-08-19 DIAGNOSIS — F1220 Cannabis dependence, uncomplicated: Secondary | ICD-10-CM | POA: Diagnosis not present

## 2023-08-20 DIAGNOSIS — F908 Attention-deficit hyperactivity disorder, other type: Secondary | ICD-10-CM | POA: Diagnosis not present

## 2023-08-20 DIAGNOSIS — E782 Mixed hyperlipidemia: Secondary | ICD-10-CM | POA: Diagnosis not present

## 2023-08-20 DIAGNOSIS — F1420 Cocaine dependence, uncomplicated: Secondary | ICD-10-CM | POA: Diagnosis not present

## 2023-08-20 DIAGNOSIS — Z6841 Body Mass Index (BMI) 40.0 and over, adult: Secondary | ICD-10-CM | POA: Diagnosis not present

## 2023-08-20 DIAGNOSIS — M79602 Pain in left arm: Secondary | ICD-10-CM | POA: Diagnosis not present

## 2023-08-20 DIAGNOSIS — Z85528 Personal history of other malignant neoplasm of kidney: Secondary | ICD-10-CM | POA: Diagnosis not present

## 2023-08-20 DIAGNOSIS — Z635 Disruption of family by separation and divorce: Secondary | ICD-10-CM | POA: Diagnosis not present

## 2023-08-20 DIAGNOSIS — Z87891 Personal history of nicotine dependence: Secondary | ICD-10-CM | POA: Diagnosis not present

## 2023-08-20 DIAGNOSIS — Z23 Encounter for immunization: Secondary | ICD-10-CM | POA: Diagnosis not present

## 2023-08-20 DIAGNOSIS — Z79899 Other long term (current) drug therapy: Secondary | ICD-10-CM | POA: Diagnosis not present

## 2023-08-20 DIAGNOSIS — E669 Obesity, unspecified: Secondary | ICD-10-CM | POA: Diagnosis not present

## 2023-08-20 DIAGNOSIS — F4310 Post-traumatic stress disorder, unspecified: Secondary | ICD-10-CM | POA: Diagnosis not present

## 2023-08-20 DIAGNOSIS — Z905 Acquired absence of kidney: Secondary | ICD-10-CM | POA: Diagnosis not present

## 2023-08-20 DIAGNOSIS — R600 Localized edema: Secondary | ICD-10-CM | POA: Diagnosis not present

## 2023-08-20 DIAGNOSIS — I10 Essential (primary) hypertension: Secondary | ICD-10-CM | POA: Diagnosis not present

## 2023-08-20 DIAGNOSIS — W57XXXA Bitten or stung by nonvenomous insect and other nonvenomous arthropods, initial encounter: Secondary | ICD-10-CM | POA: Diagnosis not present

## 2023-08-20 DIAGNOSIS — F1220 Cannabis dependence, uncomplicated: Secondary | ICD-10-CM | POA: Diagnosis not present

## 2023-08-20 DIAGNOSIS — L03114 Cellulitis of left upper limb: Secondary | ICD-10-CM | POA: Diagnosis not present

## 2023-08-20 DIAGNOSIS — Z87442 Personal history of urinary calculi: Secondary | ICD-10-CM | POA: Diagnosis not present

## 2023-08-21 DIAGNOSIS — F1220 Cannabis dependence, uncomplicated: Secondary | ICD-10-CM | POA: Diagnosis not present

## 2023-08-21 DIAGNOSIS — F1420 Cocaine dependence, uncomplicated: Secondary | ICD-10-CM | POA: Diagnosis not present

## 2023-08-22 DIAGNOSIS — F1420 Cocaine dependence, uncomplicated: Secondary | ICD-10-CM | POA: Diagnosis not present

## 2023-08-22 DIAGNOSIS — F1220 Cannabis dependence, uncomplicated: Secondary | ICD-10-CM | POA: Diagnosis not present

## 2023-08-23 DIAGNOSIS — F1220 Cannabis dependence, uncomplicated: Secondary | ICD-10-CM | POA: Diagnosis not present

## 2023-08-23 DIAGNOSIS — F1420 Cocaine dependence, uncomplicated: Secondary | ICD-10-CM | POA: Diagnosis not present

## 2023-08-24 DIAGNOSIS — F1420 Cocaine dependence, uncomplicated: Secondary | ICD-10-CM | POA: Diagnosis not present

## 2023-08-24 DIAGNOSIS — F1220 Cannabis dependence, uncomplicated: Secondary | ICD-10-CM | POA: Diagnosis not present

## 2023-08-25 DIAGNOSIS — F1420 Cocaine dependence, uncomplicated: Secondary | ICD-10-CM | POA: Diagnosis not present

## 2023-08-25 DIAGNOSIS — F1220 Cannabis dependence, uncomplicated: Secondary | ICD-10-CM | POA: Diagnosis not present

## 2023-08-26 DIAGNOSIS — F1420 Cocaine dependence, uncomplicated: Secondary | ICD-10-CM | POA: Diagnosis not present

## 2023-08-26 DIAGNOSIS — F1220 Cannabis dependence, uncomplicated: Secondary | ICD-10-CM | POA: Diagnosis not present

## 2023-08-27 DIAGNOSIS — F1220 Cannabis dependence, uncomplicated: Secondary | ICD-10-CM | POA: Diagnosis not present

## 2023-08-27 DIAGNOSIS — Z79899 Other long term (current) drug therapy: Secondary | ICD-10-CM | POA: Diagnosis not present

## 2023-08-27 DIAGNOSIS — F1420 Cocaine dependence, uncomplicated: Secondary | ICD-10-CM | POA: Diagnosis not present

## 2023-08-28 DIAGNOSIS — F1220 Cannabis dependence, uncomplicated: Secondary | ICD-10-CM | POA: Diagnosis not present

## 2023-08-28 DIAGNOSIS — F1420 Cocaine dependence, uncomplicated: Secondary | ICD-10-CM | POA: Diagnosis not present

## 2023-08-29 DIAGNOSIS — F1420 Cocaine dependence, uncomplicated: Secondary | ICD-10-CM | POA: Diagnosis not present

## 2023-08-29 DIAGNOSIS — F1220 Cannabis dependence, uncomplicated: Secondary | ICD-10-CM | POA: Diagnosis not present

## 2023-08-30 DIAGNOSIS — F1420 Cocaine dependence, uncomplicated: Secondary | ICD-10-CM | POA: Diagnosis not present

## 2023-08-30 DIAGNOSIS — F1220 Cannabis dependence, uncomplicated: Secondary | ICD-10-CM | POA: Diagnosis not present

## 2023-08-31 DIAGNOSIS — F1420 Cocaine dependence, uncomplicated: Secondary | ICD-10-CM | POA: Diagnosis not present

## 2023-08-31 DIAGNOSIS — F1220 Cannabis dependence, uncomplicated: Secondary | ICD-10-CM | POA: Diagnosis not present

## 2023-09-01 DIAGNOSIS — F32A Depression, unspecified: Secondary | ICD-10-CM | POA: Diagnosis not present

## 2023-09-01 DIAGNOSIS — F411 Generalized anxiety disorder: Secondary | ICD-10-CM | POA: Diagnosis not present

## 2023-09-01 DIAGNOSIS — F1220 Cannabis dependence, uncomplicated: Secondary | ICD-10-CM | POA: Diagnosis not present

## 2023-09-01 DIAGNOSIS — F1420 Cocaine dependence, uncomplicated: Secondary | ICD-10-CM | POA: Diagnosis not present

## 2023-09-02 DIAGNOSIS — F1420 Cocaine dependence, uncomplicated: Secondary | ICD-10-CM | POA: Diagnosis not present

## 2023-09-02 DIAGNOSIS — F1220 Cannabis dependence, uncomplicated: Secondary | ICD-10-CM | POA: Diagnosis not present

## 2023-09-03 DIAGNOSIS — F1220 Cannabis dependence, uncomplicated: Secondary | ICD-10-CM | POA: Diagnosis not present

## 2023-09-03 DIAGNOSIS — F1420 Cocaine dependence, uncomplicated: Secondary | ICD-10-CM | POA: Diagnosis not present

## 2023-09-04 DIAGNOSIS — F1220 Cannabis dependence, uncomplicated: Secondary | ICD-10-CM | POA: Diagnosis not present

## 2023-09-04 DIAGNOSIS — F1420 Cocaine dependence, uncomplicated: Secondary | ICD-10-CM | POA: Diagnosis not present

## 2023-09-05 DIAGNOSIS — F1220 Cannabis dependence, uncomplicated: Secondary | ICD-10-CM | POA: Diagnosis not present

## 2023-09-05 DIAGNOSIS — F1420 Cocaine dependence, uncomplicated: Secondary | ICD-10-CM | POA: Diagnosis not present

## 2023-09-06 DIAGNOSIS — F1420 Cocaine dependence, uncomplicated: Secondary | ICD-10-CM | POA: Diagnosis not present

## 2023-09-06 DIAGNOSIS — F1220 Cannabis dependence, uncomplicated: Secondary | ICD-10-CM | POA: Diagnosis not present

## 2023-09-07 DIAGNOSIS — F1220 Cannabis dependence, uncomplicated: Secondary | ICD-10-CM | POA: Diagnosis not present

## 2023-09-07 DIAGNOSIS — F1420 Cocaine dependence, uncomplicated: Secondary | ICD-10-CM | POA: Diagnosis not present

## 2023-09-08 DIAGNOSIS — F1420 Cocaine dependence, uncomplicated: Secondary | ICD-10-CM | POA: Diagnosis not present

## 2023-09-08 DIAGNOSIS — F1220 Cannabis dependence, uncomplicated: Secondary | ICD-10-CM | POA: Diagnosis not present

## 2023-09-09 DIAGNOSIS — Z79899 Other long term (current) drug therapy: Secondary | ICD-10-CM | POA: Diagnosis not present

## 2023-09-09 DIAGNOSIS — F1220 Cannabis dependence, uncomplicated: Secondary | ICD-10-CM | POA: Diagnosis not present

## 2023-09-09 DIAGNOSIS — F1420 Cocaine dependence, uncomplicated: Secondary | ICD-10-CM | POA: Diagnosis not present

## 2023-09-10 DIAGNOSIS — F1420 Cocaine dependence, uncomplicated: Secondary | ICD-10-CM | POA: Diagnosis not present

## 2023-09-10 DIAGNOSIS — F1220 Cannabis dependence, uncomplicated: Secondary | ICD-10-CM | POA: Diagnosis not present

## 2023-09-11 DIAGNOSIS — F1220 Cannabis dependence, uncomplicated: Secondary | ICD-10-CM | POA: Diagnosis not present

## 2023-09-11 DIAGNOSIS — F1420 Cocaine dependence, uncomplicated: Secondary | ICD-10-CM | POA: Diagnosis not present

## 2023-09-12 DIAGNOSIS — F1220 Cannabis dependence, uncomplicated: Secondary | ICD-10-CM | POA: Diagnosis not present

## 2023-09-12 DIAGNOSIS — Z79899 Other long term (current) drug therapy: Secondary | ICD-10-CM | POA: Diagnosis not present

## 2023-09-12 DIAGNOSIS — F1420 Cocaine dependence, uncomplicated: Secondary | ICD-10-CM | POA: Diagnosis not present

## 2023-09-13 DIAGNOSIS — F1420 Cocaine dependence, uncomplicated: Secondary | ICD-10-CM | POA: Diagnosis not present

## 2023-09-13 DIAGNOSIS — F1220 Cannabis dependence, uncomplicated: Secondary | ICD-10-CM | POA: Diagnosis not present

## 2023-09-14 DIAGNOSIS — F1220 Cannabis dependence, uncomplicated: Secondary | ICD-10-CM | POA: Diagnosis not present

## 2023-09-14 DIAGNOSIS — F1420 Cocaine dependence, uncomplicated: Secondary | ICD-10-CM | POA: Diagnosis not present

## 2023-09-15 DIAGNOSIS — Z85528 Personal history of other malignant neoplasm of kidney: Secondary | ICD-10-CM | POA: Diagnosis not present

## 2023-09-15 DIAGNOSIS — R339 Retention of urine, unspecified: Secondary | ICD-10-CM | POA: Diagnosis not present

## 2023-09-15 DIAGNOSIS — N200 Calculus of kidney: Secondary | ICD-10-CM | POA: Diagnosis not present

## 2023-09-15 DIAGNOSIS — M549 Dorsalgia, unspecified: Secondary | ICD-10-CM | POA: Diagnosis not present

## 2023-09-15 DIAGNOSIS — Z872 Personal history of diseases of the skin and subcutaneous tissue: Secondary | ICD-10-CM | POA: Diagnosis not present

## 2023-09-15 DIAGNOSIS — N2889 Other specified disorders of kidney and ureter: Secondary | ICD-10-CM | POA: Diagnosis not present

## 2023-09-15 DIAGNOSIS — F1220 Cannabis dependence, uncomplicated: Secondary | ICD-10-CM | POA: Diagnosis not present

## 2023-09-15 DIAGNOSIS — R300 Dysuria: Secondary | ICD-10-CM | POA: Diagnosis not present

## 2023-09-15 DIAGNOSIS — Z87891 Personal history of nicotine dependence: Secondary | ICD-10-CM | POA: Diagnosis not present

## 2023-09-15 DIAGNOSIS — R1031 Right lower quadrant pain: Secondary | ICD-10-CM | POA: Diagnosis not present

## 2023-09-15 DIAGNOSIS — R1011 Right upper quadrant pain: Secondary | ICD-10-CM | POA: Diagnosis not present

## 2023-09-15 DIAGNOSIS — N289 Disorder of kidney and ureter, unspecified: Secondary | ICD-10-CM | POA: Diagnosis not present

## 2023-09-15 DIAGNOSIS — Z87442 Personal history of urinary calculi: Secondary | ICD-10-CM | POA: Diagnosis not present

## 2023-09-15 DIAGNOSIS — F1420 Cocaine dependence, uncomplicated: Secondary | ICD-10-CM | POA: Diagnosis not present

## 2023-09-16 DIAGNOSIS — Z79899 Other long term (current) drug therapy: Secondary | ICD-10-CM | POA: Diagnosis not present

## 2023-09-16 DIAGNOSIS — F32A Depression, unspecified: Secondary | ICD-10-CM | POA: Diagnosis not present

## 2023-09-16 DIAGNOSIS — F1220 Cannabis dependence, uncomplicated: Secondary | ICD-10-CM | POA: Diagnosis not present

## 2023-09-16 DIAGNOSIS — F1420 Cocaine dependence, uncomplicated: Secondary | ICD-10-CM | POA: Diagnosis not present

## 2023-09-16 DIAGNOSIS — F411 Generalized anxiety disorder: Secondary | ICD-10-CM | POA: Diagnosis not present

## 2023-09-17 DIAGNOSIS — F1420 Cocaine dependence, uncomplicated: Secondary | ICD-10-CM | POA: Diagnosis not present

## 2023-09-17 DIAGNOSIS — F1220 Cannabis dependence, uncomplicated: Secondary | ICD-10-CM | POA: Diagnosis not present

## 2023-09-18 DIAGNOSIS — F1220 Cannabis dependence, uncomplicated: Secondary | ICD-10-CM | POA: Diagnosis not present

## 2023-09-18 DIAGNOSIS — F1420 Cocaine dependence, uncomplicated: Secondary | ICD-10-CM | POA: Diagnosis not present

## 2023-09-19 DIAGNOSIS — F1220 Cannabis dependence, uncomplicated: Secondary | ICD-10-CM | POA: Diagnosis not present

## 2023-09-19 DIAGNOSIS — F1420 Cocaine dependence, uncomplicated: Secondary | ICD-10-CM | POA: Diagnosis not present

## 2023-09-20 DIAGNOSIS — F1420 Cocaine dependence, uncomplicated: Secondary | ICD-10-CM | POA: Diagnosis not present

## 2023-09-20 DIAGNOSIS — F1220 Cannabis dependence, uncomplicated: Secondary | ICD-10-CM | POA: Diagnosis not present

## 2023-09-22 DIAGNOSIS — F1220 Cannabis dependence, uncomplicated: Secondary | ICD-10-CM | POA: Diagnosis not present

## 2023-09-22 DIAGNOSIS — D4102 Neoplasm of uncertain behavior of left kidney: Secondary | ICD-10-CM | POA: Diagnosis not present

## 2023-09-22 DIAGNOSIS — F1420 Cocaine dependence, uncomplicated: Secondary | ICD-10-CM | POA: Diagnosis not present

## 2023-09-24 DIAGNOSIS — F1420 Cocaine dependence, uncomplicated: Secondary | ICD-10-CM | POA: Diagnosis not present

## 2023-09-24 DIAGNOSIS — Z79899 Other long term (current) drug therapy: Secondary | ICD-10-CM | POA: Diagnosis not present

## 2023-09-24 DIAGNOSIS — F1220 Cannabis dependence, uncomplicated: Secondary | ICD-10-CM | POA: Diagnosis not present

## 2023-09-25 DIAGNOSIS — F1220 Cannabis dependence, uncomplicated: Secondary | ICD-10-CM | POA: Diagnosis not present

## 2023-09-25 DIAGNOSIS — F1420 Cocaine dependence, uncomplicated: Secondary | ICD-10-CM | POA: Diagnosis not present
